# Patient Record
Sex: Female | Born: 1960 | Race: White | Hispanic: No | Smoking: Current every day smoker
Health system: Southern US, Community
[De-identification: ages and names within clinical notes are randomized; demographics above are authoritative.]

## PROBLEM LIST (undated history)

## (undated) DIAGNOSIS — F32A Depression, unspecified: Secondary | ICD-10-CM

## (undated) DIAGNOSIS — E785 Hyperlipidemia, unspecified: Secondary | ICD-10-CM

## (undated) DIAGNOSIS — M2550 Pain in unspecified joint: Secondary | ICD-10-CM

## (undated) DIAGNOSIS — F419 Anxiety disorder, unspecified: Secondary | ICD-10-CM

## (undated) DIAGNOSIS — R87810 Cervical high risk human papillomavirus (HPV) DNA test positive: Secondary | ICD-10-CM

## (undated) DIAGNOSIS — R87619 Unspecified abnormal cytological findings in specimens from cervix uteri: Secondary | ICD-10-CM

## (undated) DIAGNOSIS — F329 Major depressive disorder, single episode, unspecified: Secondary | ICD-10-CM

## (undated) HISTORY — DX: Depression, unspecified: F32.A

## (undated) HISTORY — DX: Anxiety disorder, unspecified: F41.9

## (undated) HISTORY — PX: PELVIC FRACTURE SURGERY: SHX119

## (undated) HISTORY — DX: Major depressive disorder, single episode, unspecified: F32.9

## (undated) HISTORY — DX: Cervical high risk human papillomavirus (HPV) DNA test positive: R87.810

## (undated) HISTORY — PX: BLADDER SURGERY: SHX569

## (undated) HISTORY — DX: Pain in unspecified joint: M25.50

## (undated) HISTORY — DX: Hyperlipidemia, unspecified: E78.5

## (undated) HISTORY — DX: Unspecified abnormal cytological findings in specimens from cervix uteri: R87.619

---

## 2000-05-17 HISTORY — PX: CLAVICLE SURGERY: SHX598

## 2005-09-27 ENCOUNTER — Ambulatory Visit: Payer: Self-pay | Admitting: Family Medicine

## 2006-11-02 ENCOUNTER — Ambulatory Visit: Payer: Self-pay | Admitting: Family Medicine

## 2008-04-08 ENCOUNTER — Ambulatory Visit: Payer: Self-pay | Admitting: Family Medicine

## 2008-11-18 IMAGING — MG MMM DGTL SCREENING MAMMO W/CAD
2 series · 8 of 8 positions shown · non-contrast
Comparison: none

REASON FOR EXAM: Screening Mammo
COMMENTS:

[Series 861: R CC · right · 4 of 4 slices shown (1 of 2)]
[im 1/4]
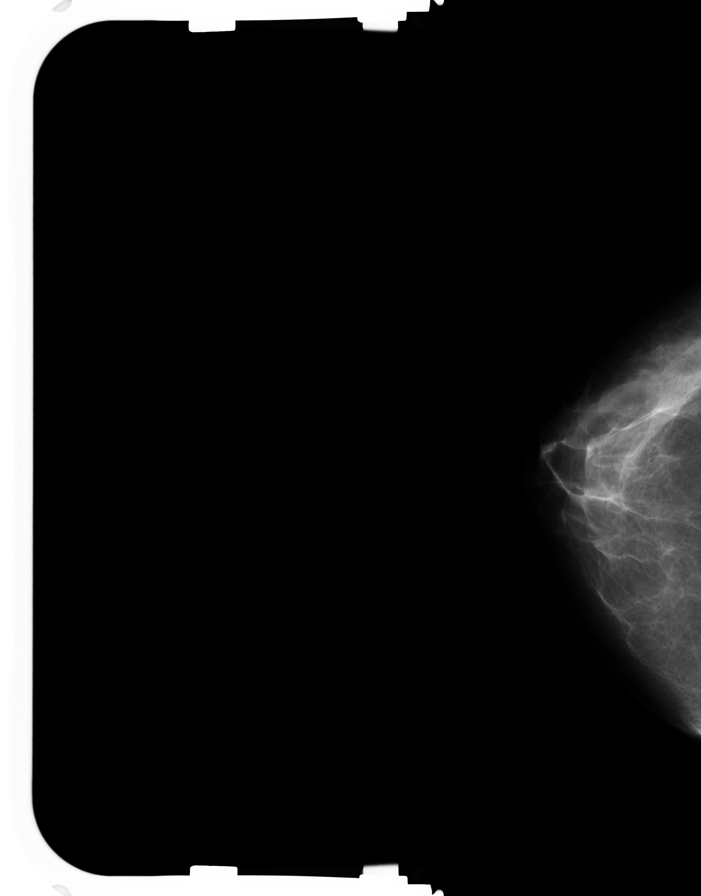
[im 2/4]
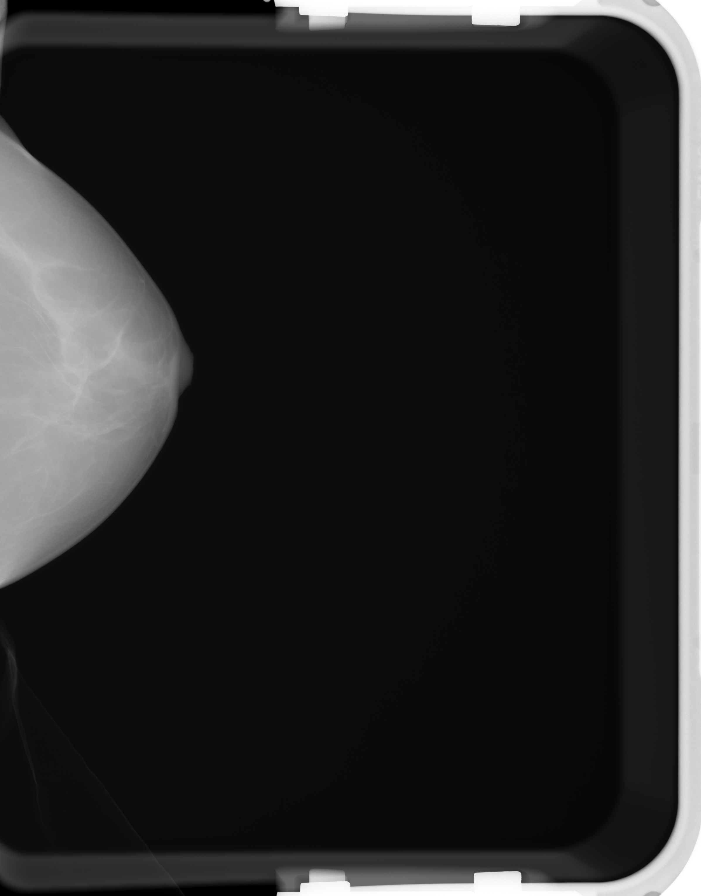
[im 3/4]
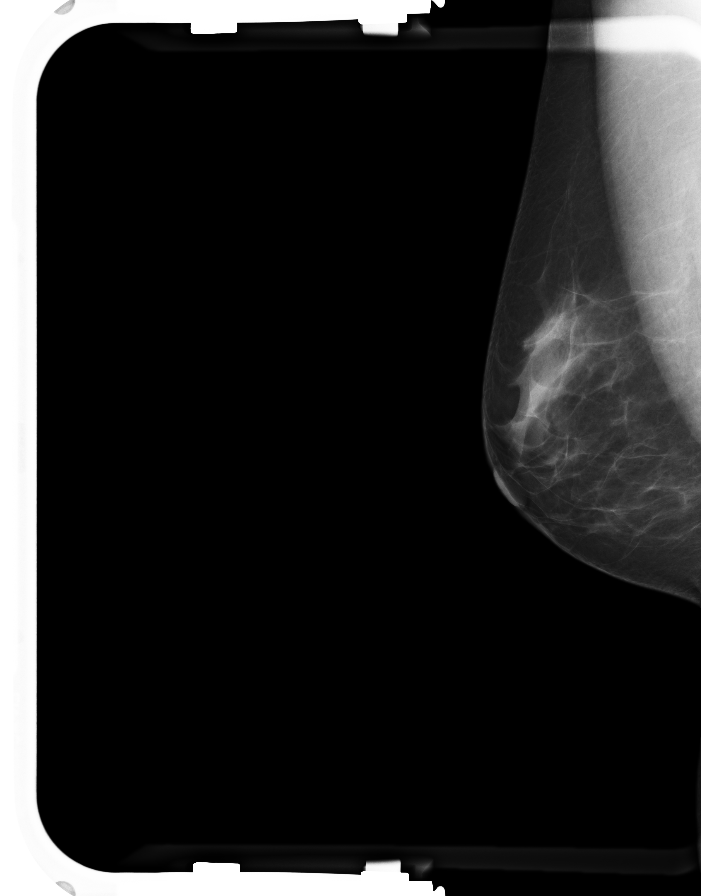
[im 4/4]
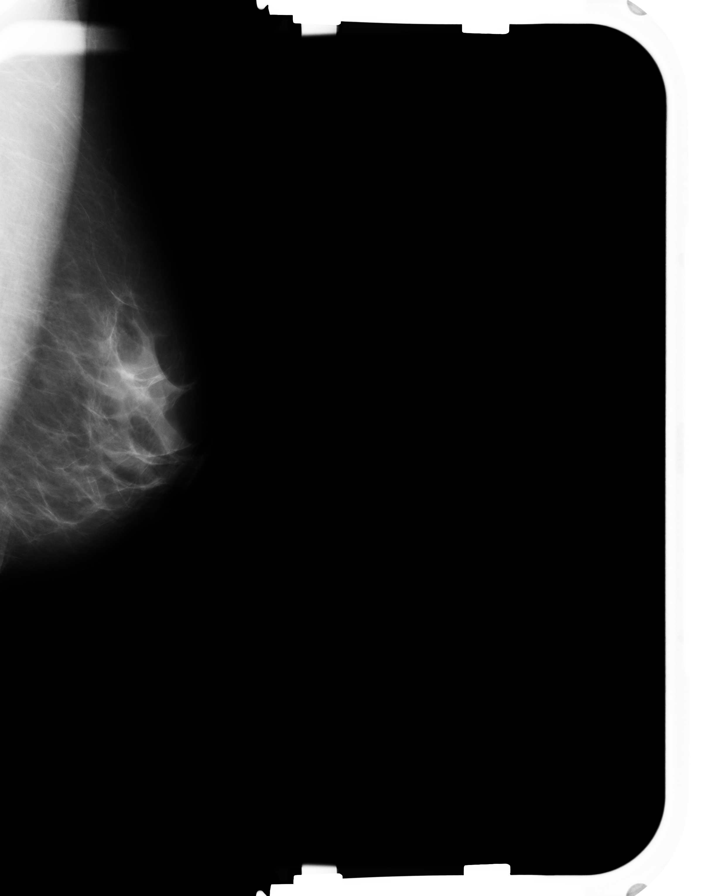

[Series 864: R CC · right · 4 of 4 slices shown (2 of 2)]
[im 1/4]
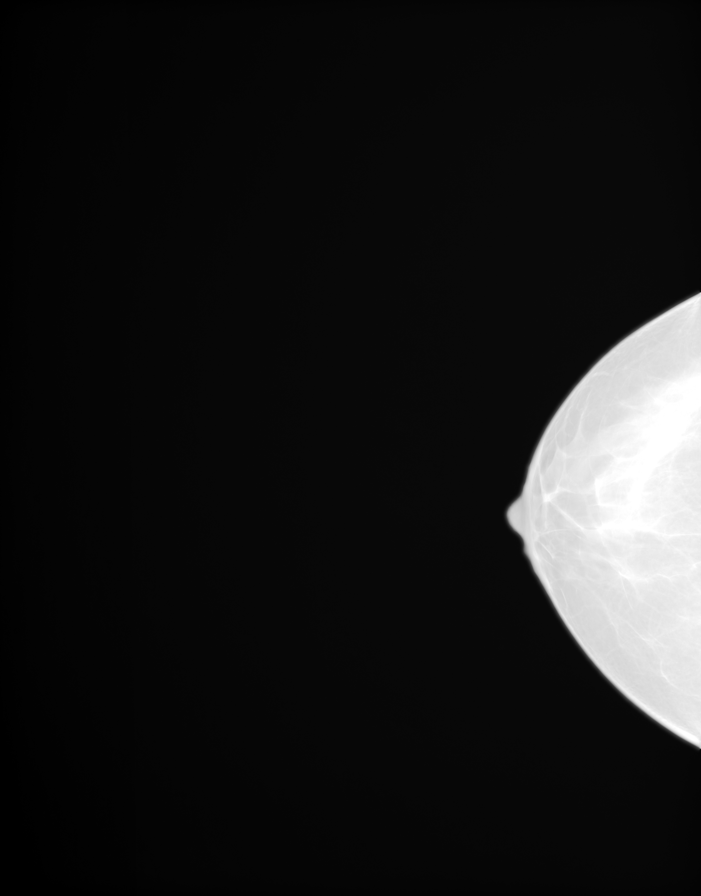
[im 2/4]
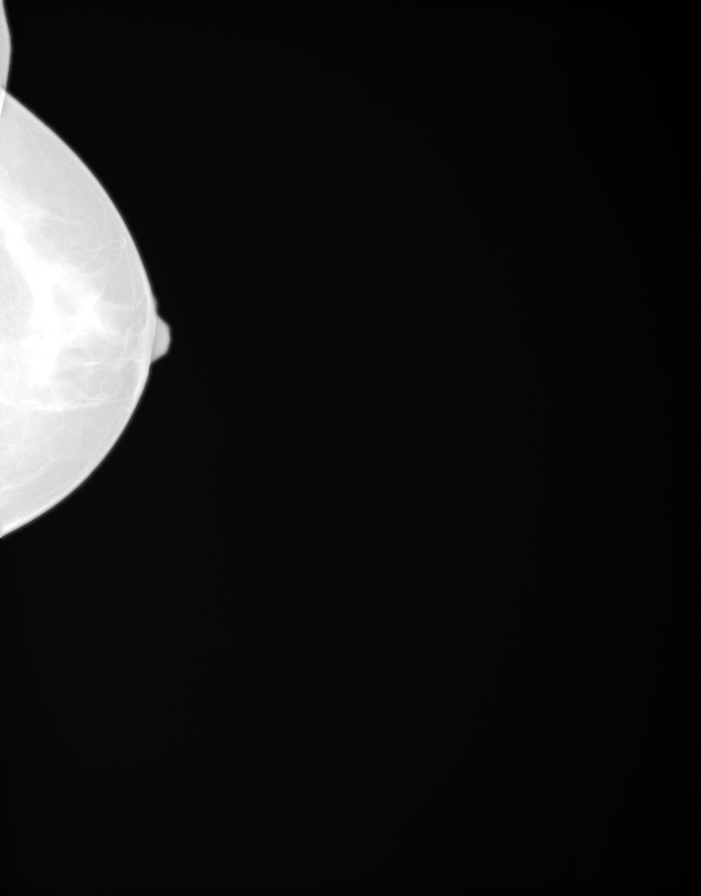
[im 3/4]
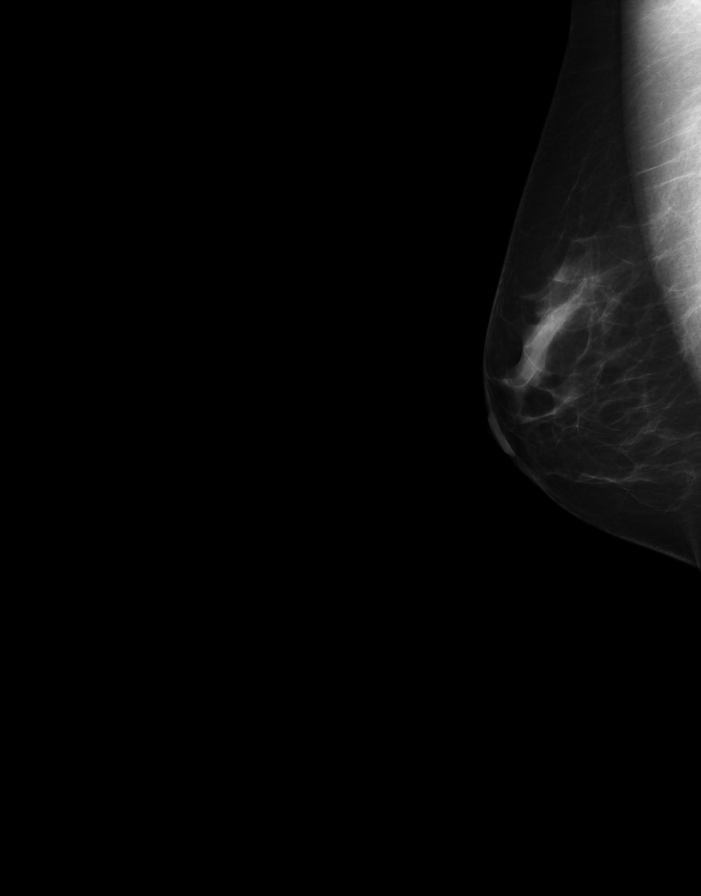
[im 4/4]
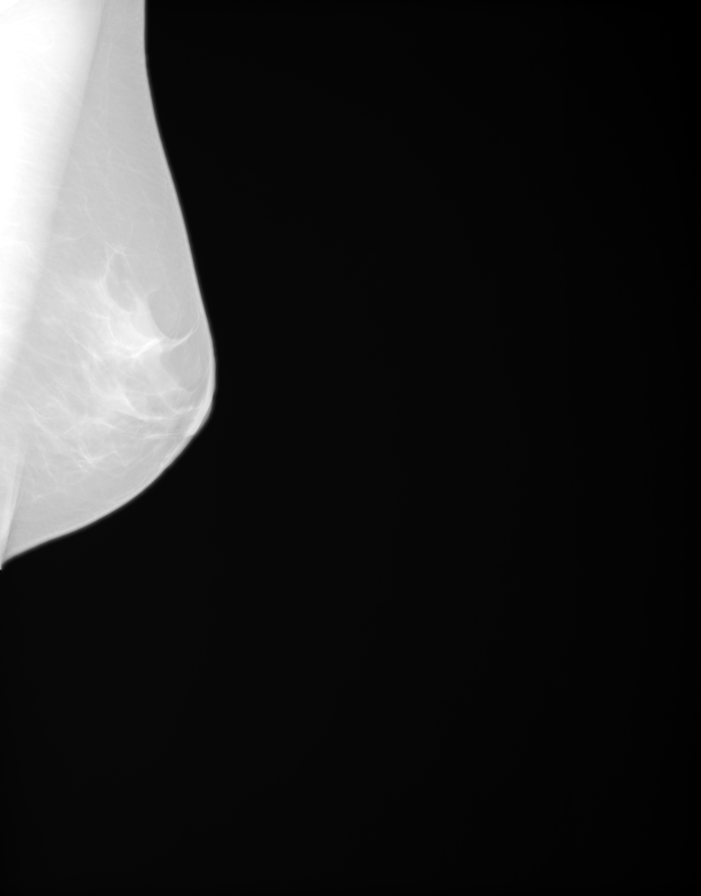

[8 of 8 positions shown; findings below may reference images not displayed]

PROCEDURE:     MMM - MMM DGTL SCREENING MAMMO W/CAD  - November 02, 2006  [DATE]

RESULT:     Comparison is made to film screen images performed on 05/04/96
as well as 09/27/05.  The breasts exhibit a mildly dense to moderately dense
parenchymal pattern.  There is no evidence of an area of developing density,
dominant mass, or malignant calcification.  The appearance is stable.
IMPRESSION: Stable, benign-appearing bilateral mammogram.

BI-RADS 2 - Benign Findings.

Please continue to encourage annual mammographic followup.

A NEGATIVE MAMMOGRAM REPORT DOES NOT PRECLUDE BIOPSY OR OTHER EVALUATION OF
A CLINICALLY PALPABLE OR OTHERWISE SUSPICIOUS MASS OR LESION.  BREAST CANCER
MAY NOT BE DETECTED BY MAMMOGRAPHY IN UP TO 10% OF CASES.

## 2009-04-28 ENCOUNTER — Ambulatory Visit: Payer: Self-pay | Admitting: Family Medicine

## 2010-04-25 IMAGING — MG MMM DGTL SCREENING MAMMO W/CAD
1 series · 4 of 4 positions shown · non-contrast
Comparison: none

REASON FOR EXAM: Screening mammogram
COMMENTS:

PROCEDURE:     MMM - MMM DGTL SCREENING MAMMO W/CAD  - April 08, 2008  [DATE]
RESULT:     No dominant masses or pathologic clustered calcifications are
demonstrated. CAD evaluation is nonfocal.

[Series 5187: R CC · right · 4 of 4 slices shown]
[im 1/4]
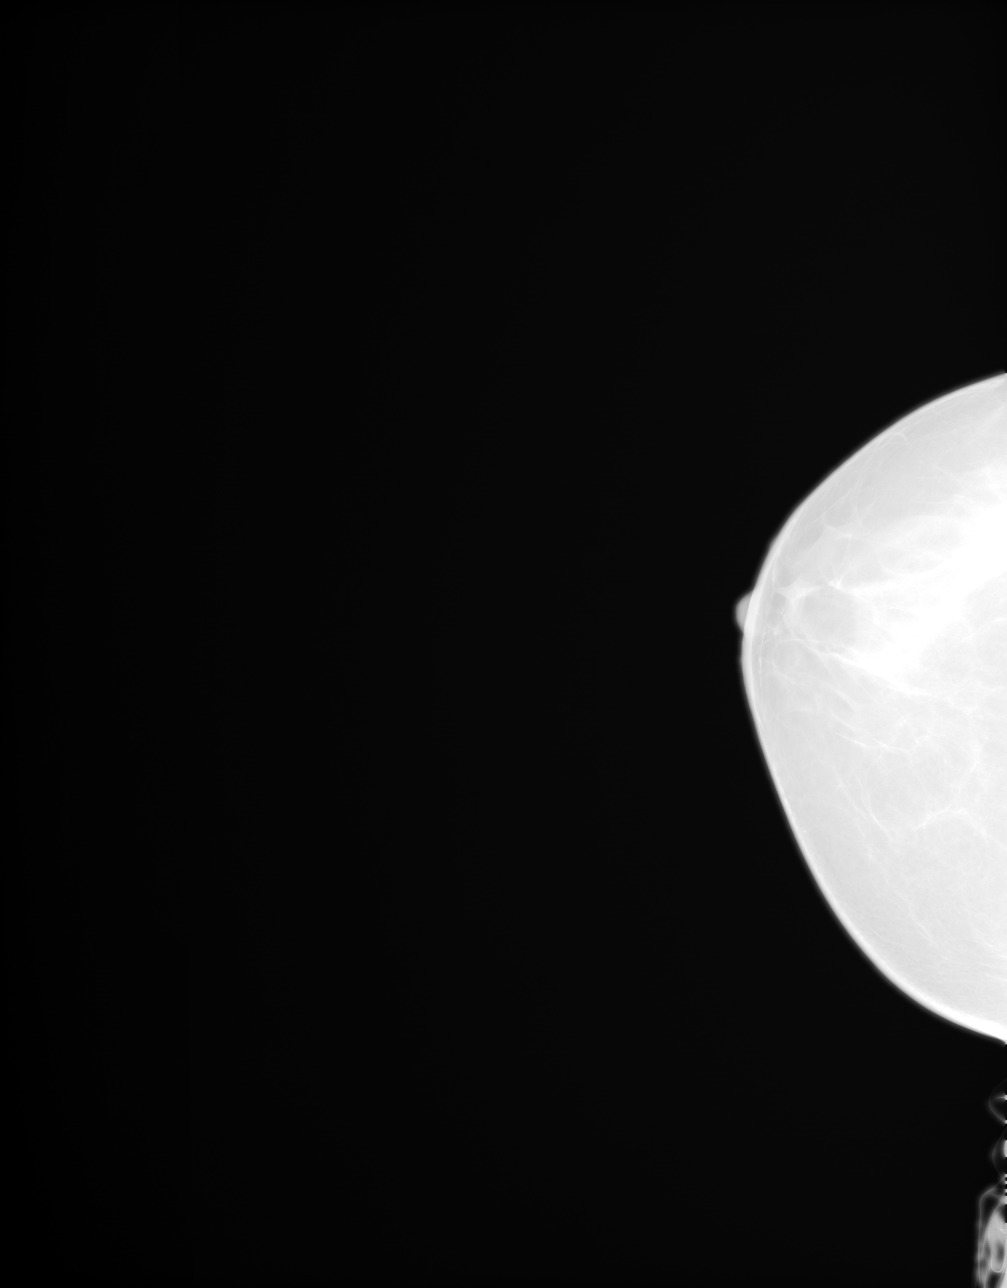
[im 2/4]
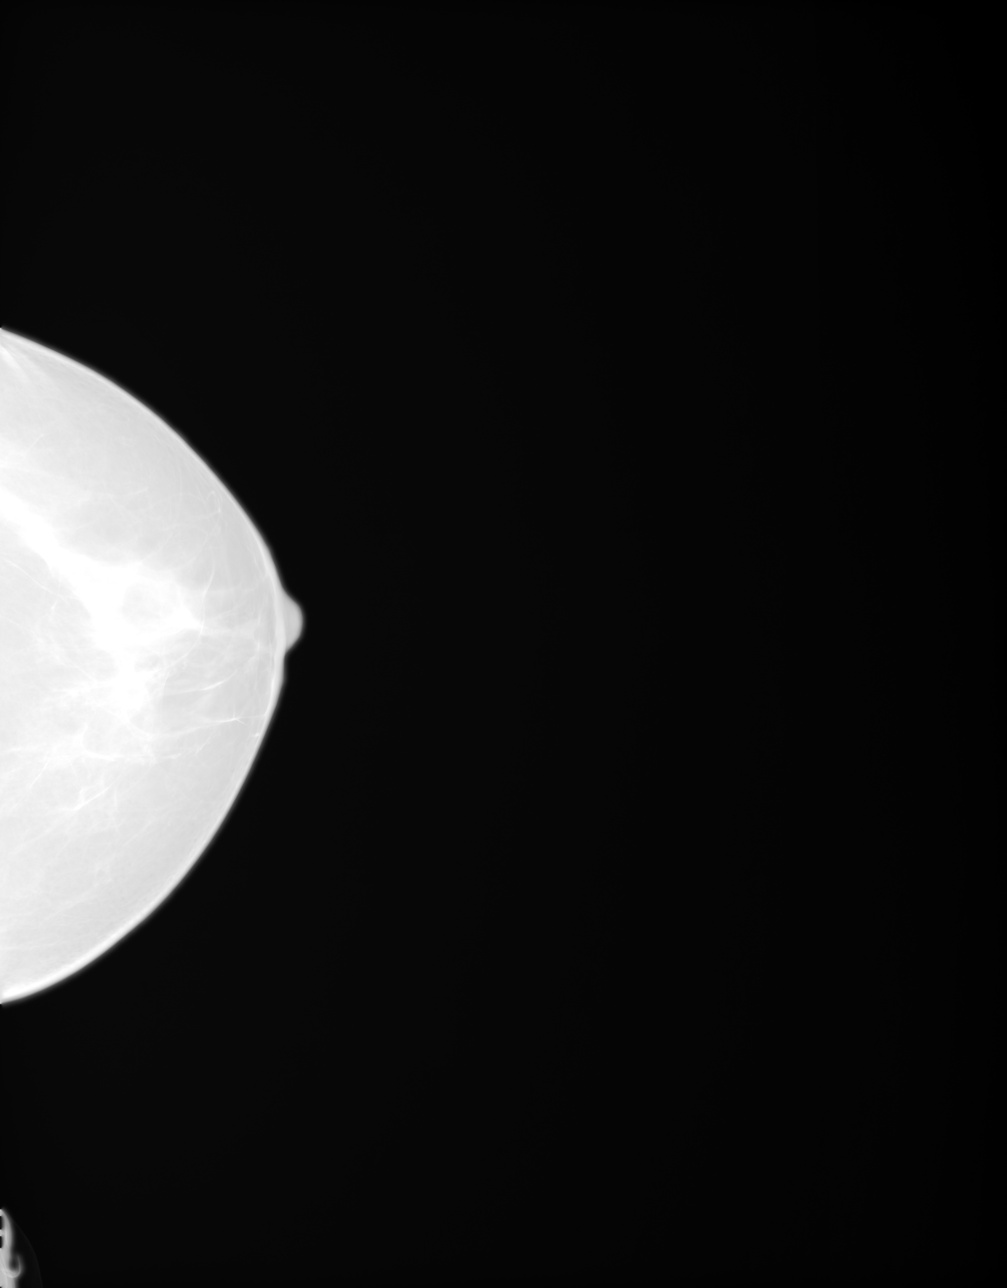
[im 3/4]
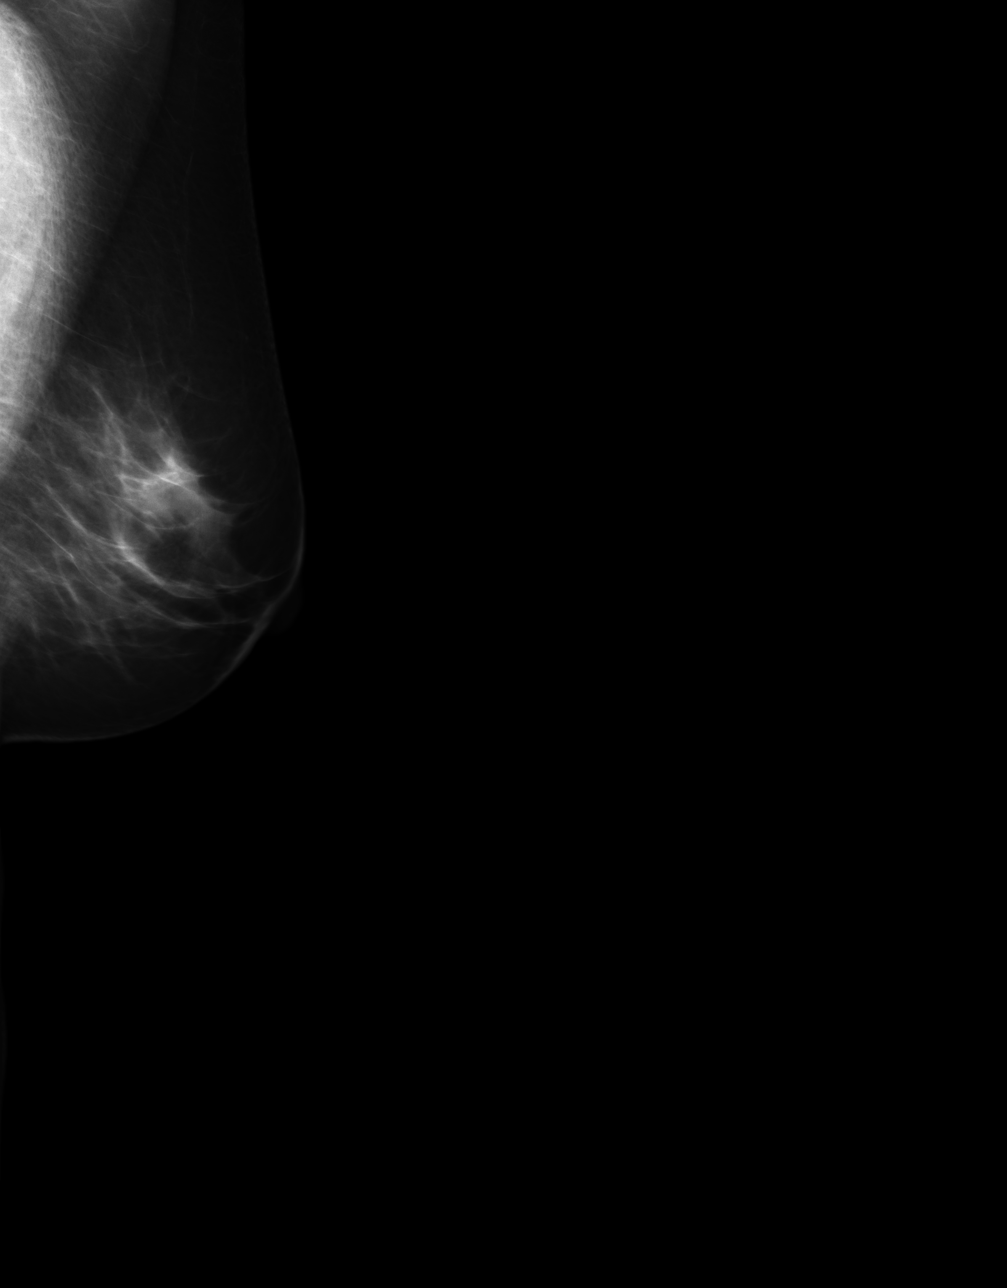
[im 4/4]
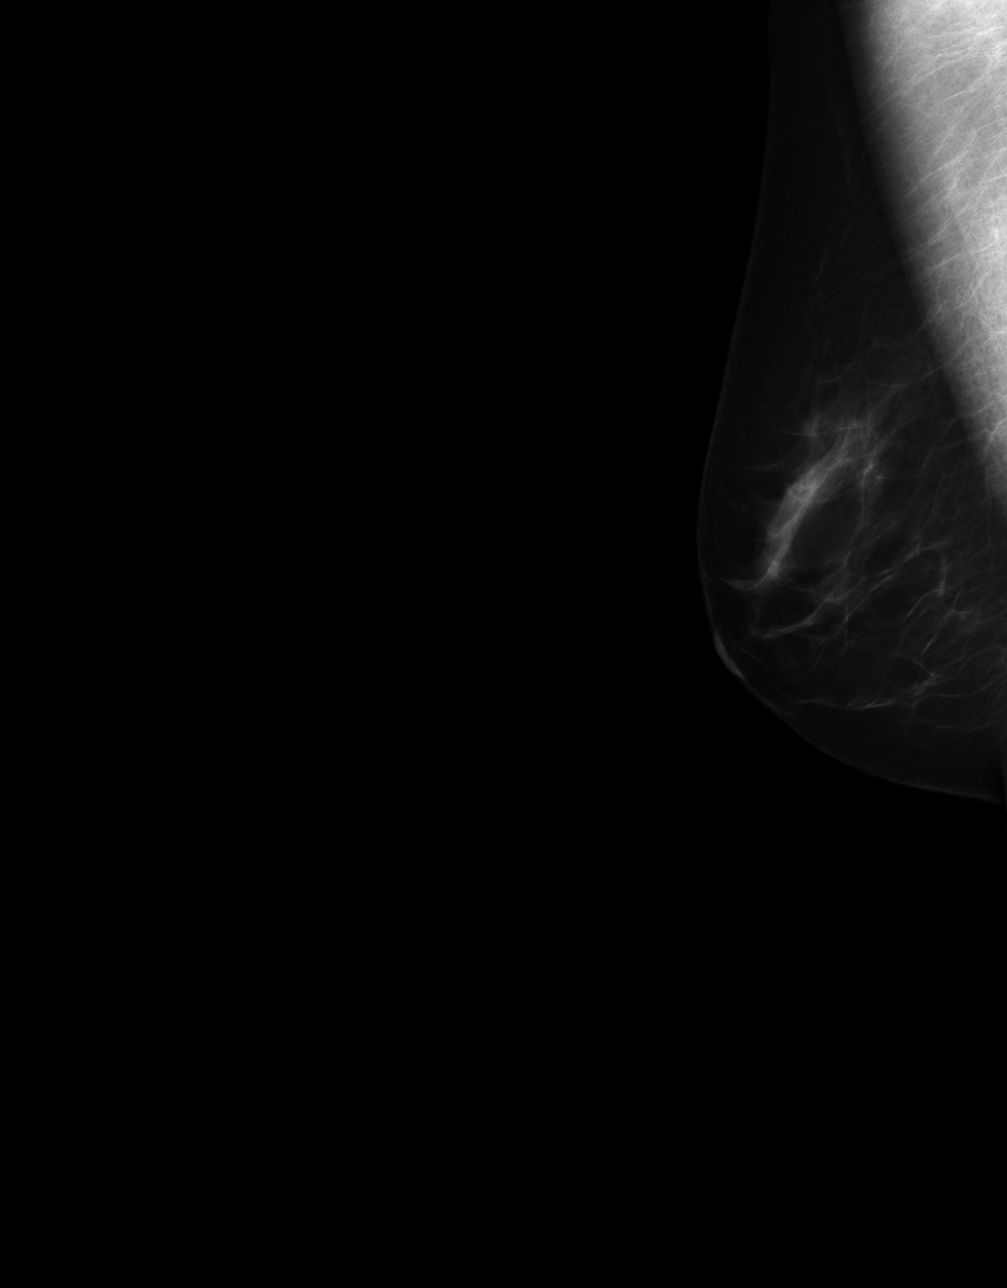

[4 of 4 positions shown; findings below may reference images not displayed]

IMPRESSION: Benign exam. Routine yearly follow-up mammograms are
suggested.

BI-RADS: Category 2 - Benign Finding

## 2010-05-20 HISTORY — PX: COLPOSCOPY: SHX161

## 2010-12-16 HISTORY — PX: FINGER FRACTURE SURGERY: SHX638

## 2011-05-15 IMAGING — MG MMM DGTL SCREENING MAMMO W/CAD
1 series · 4 of 4 positions shown · non-contrast
Comparison: none

REASON FOR EXAM: SCREENING
COMMENTS:

PROCEDURE:     MMM - MMM DGTL SCREENING MAMMO W/CAD  - April 28, 2009 [DATE]
RESULT:       Comparison is made to prior examinations of 04/08/08, 11/02/06
and 09/27/05.
No mass or  malignant-appearing microcalcifications are seen. There are
scattered fibroglandular densities bilaterally.

[Series 762: R CC · right · 4 of 4 slices shown]
[im 1/4]
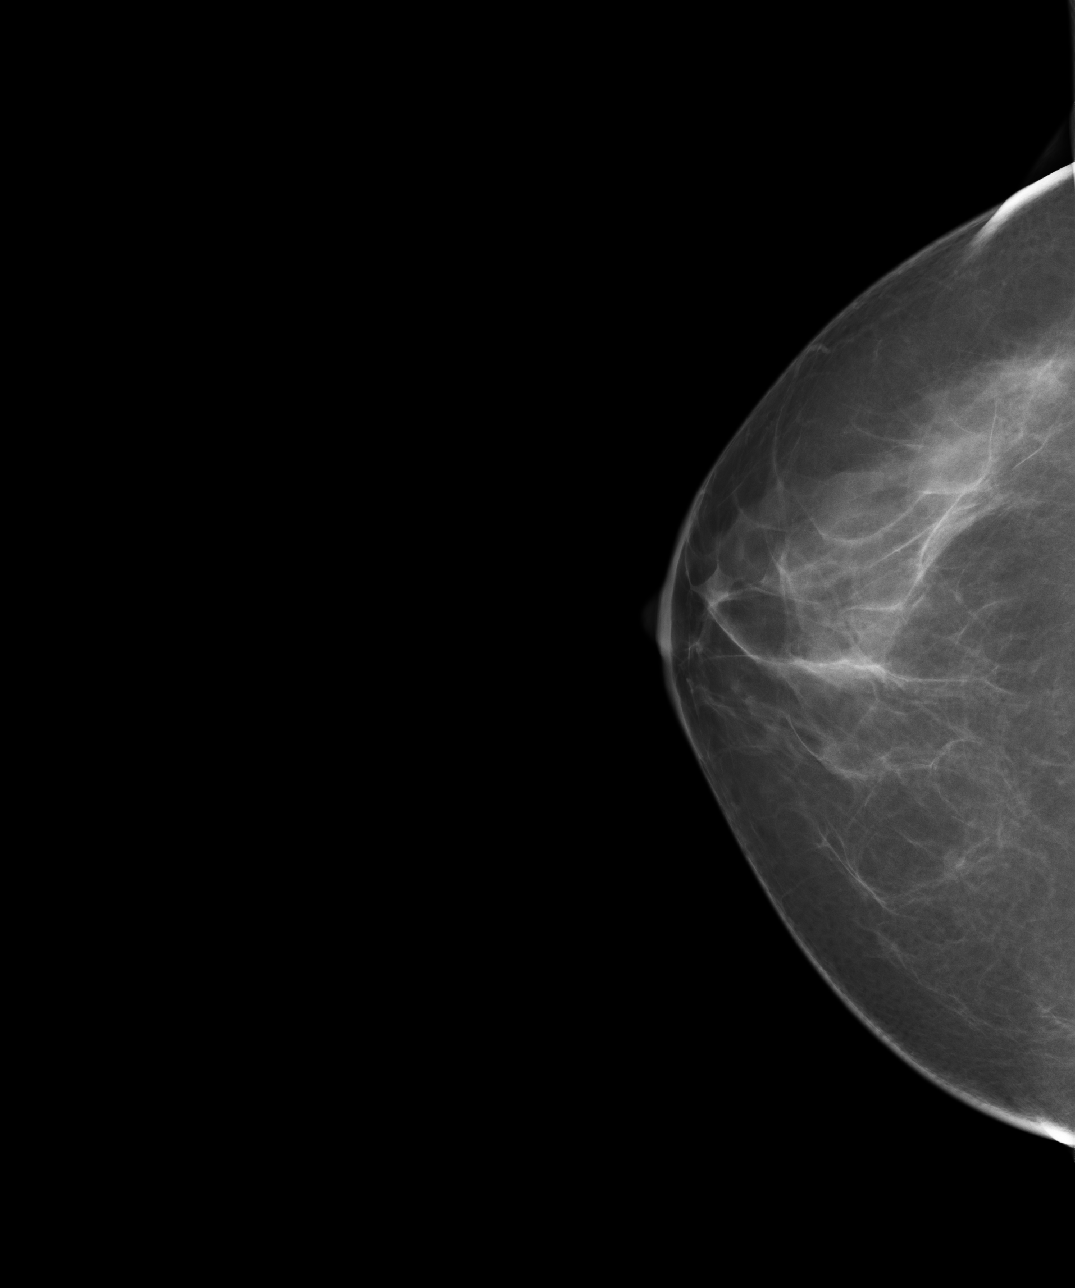
[im 2/4]
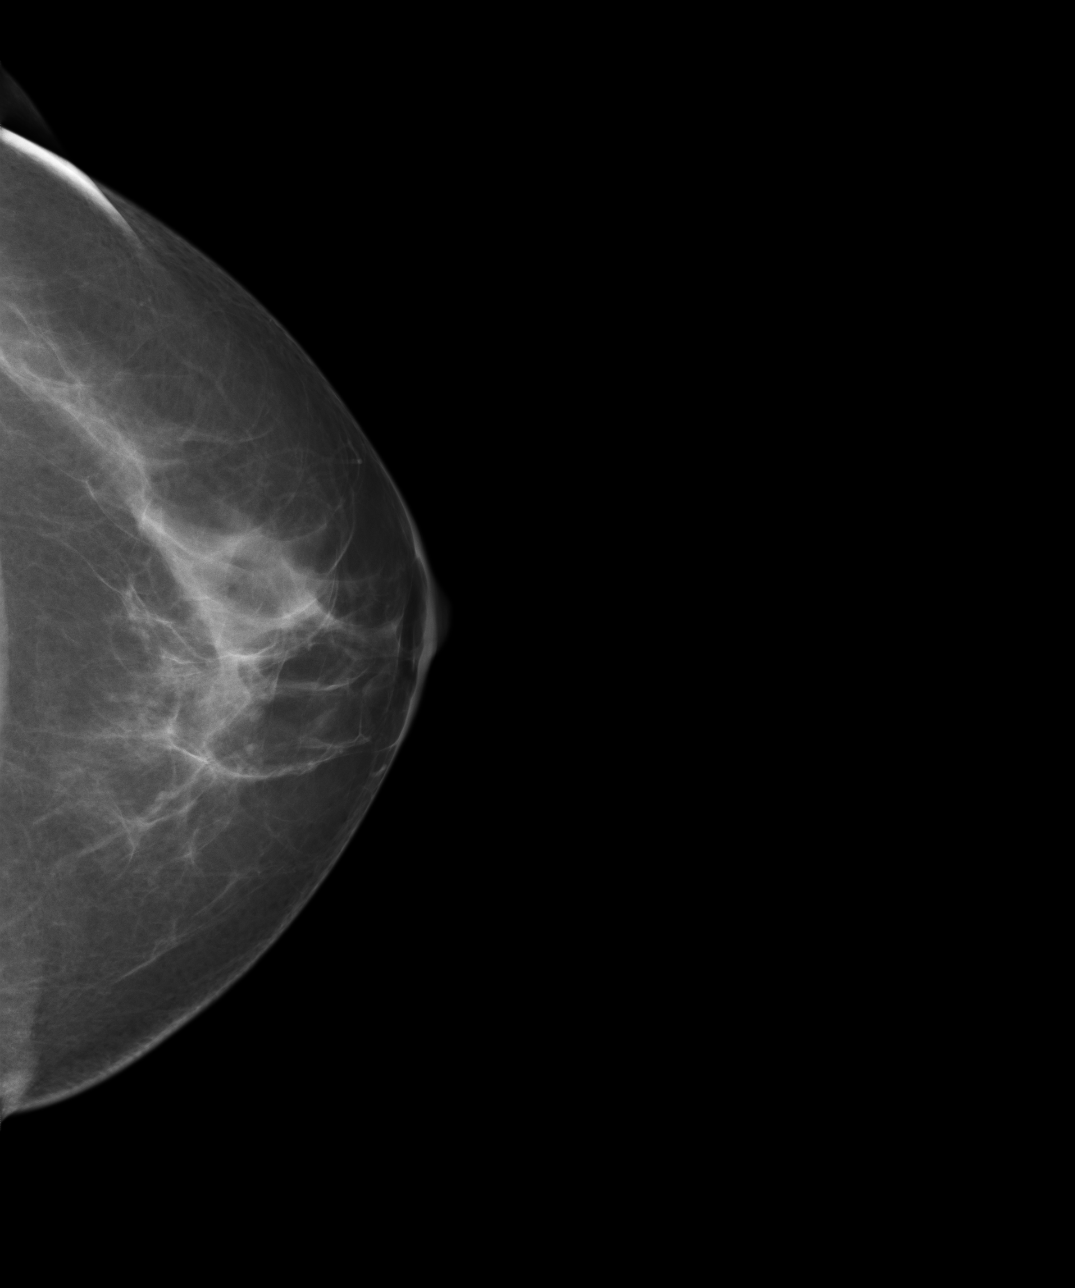
[im 3/4]
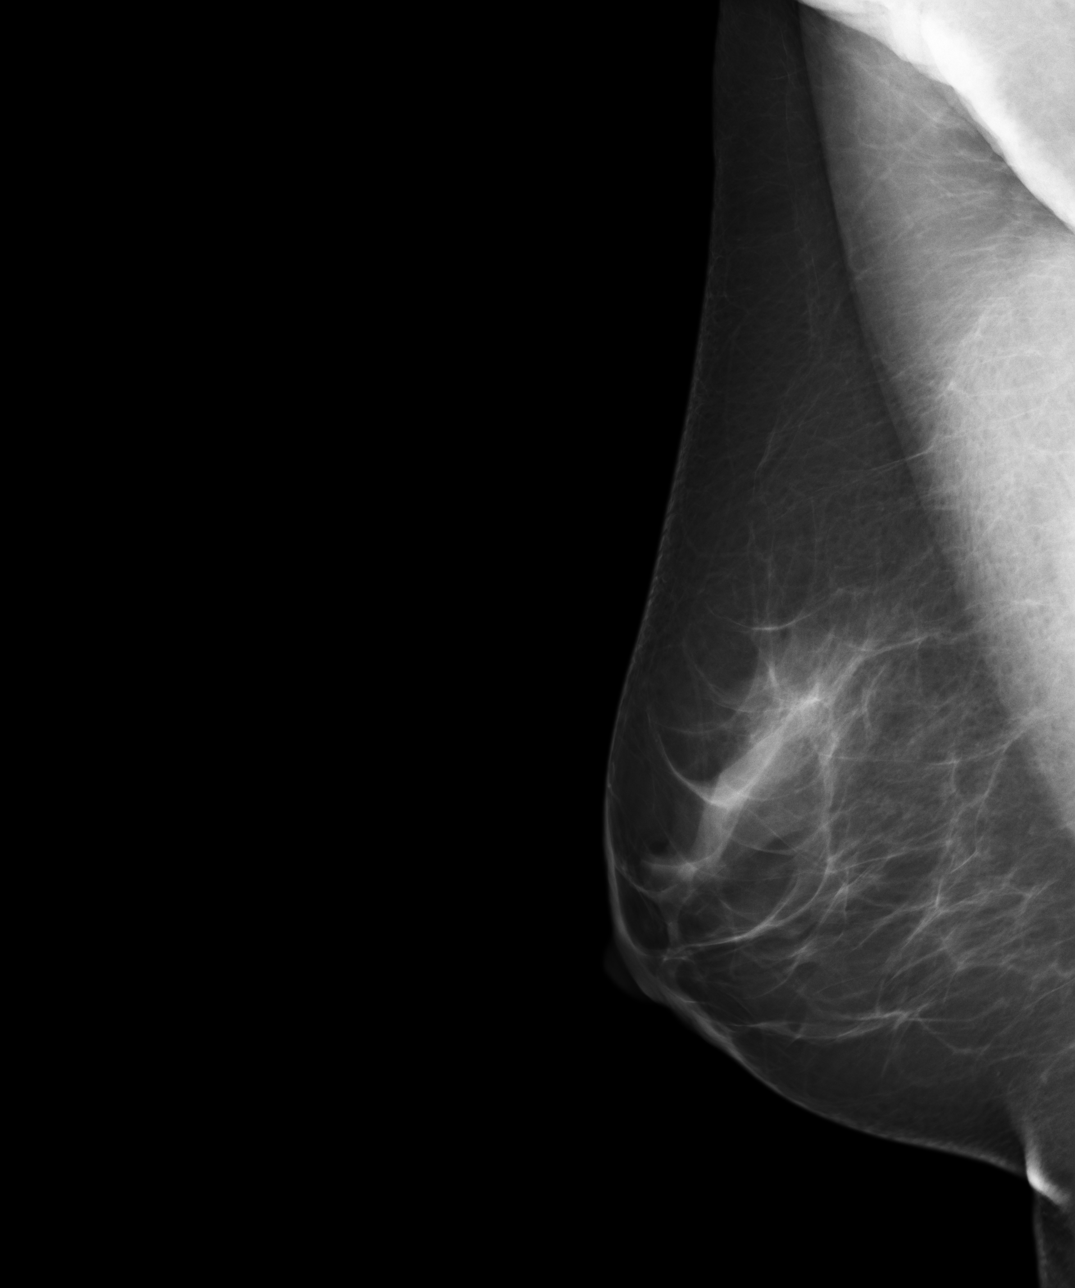
[im 4/4]
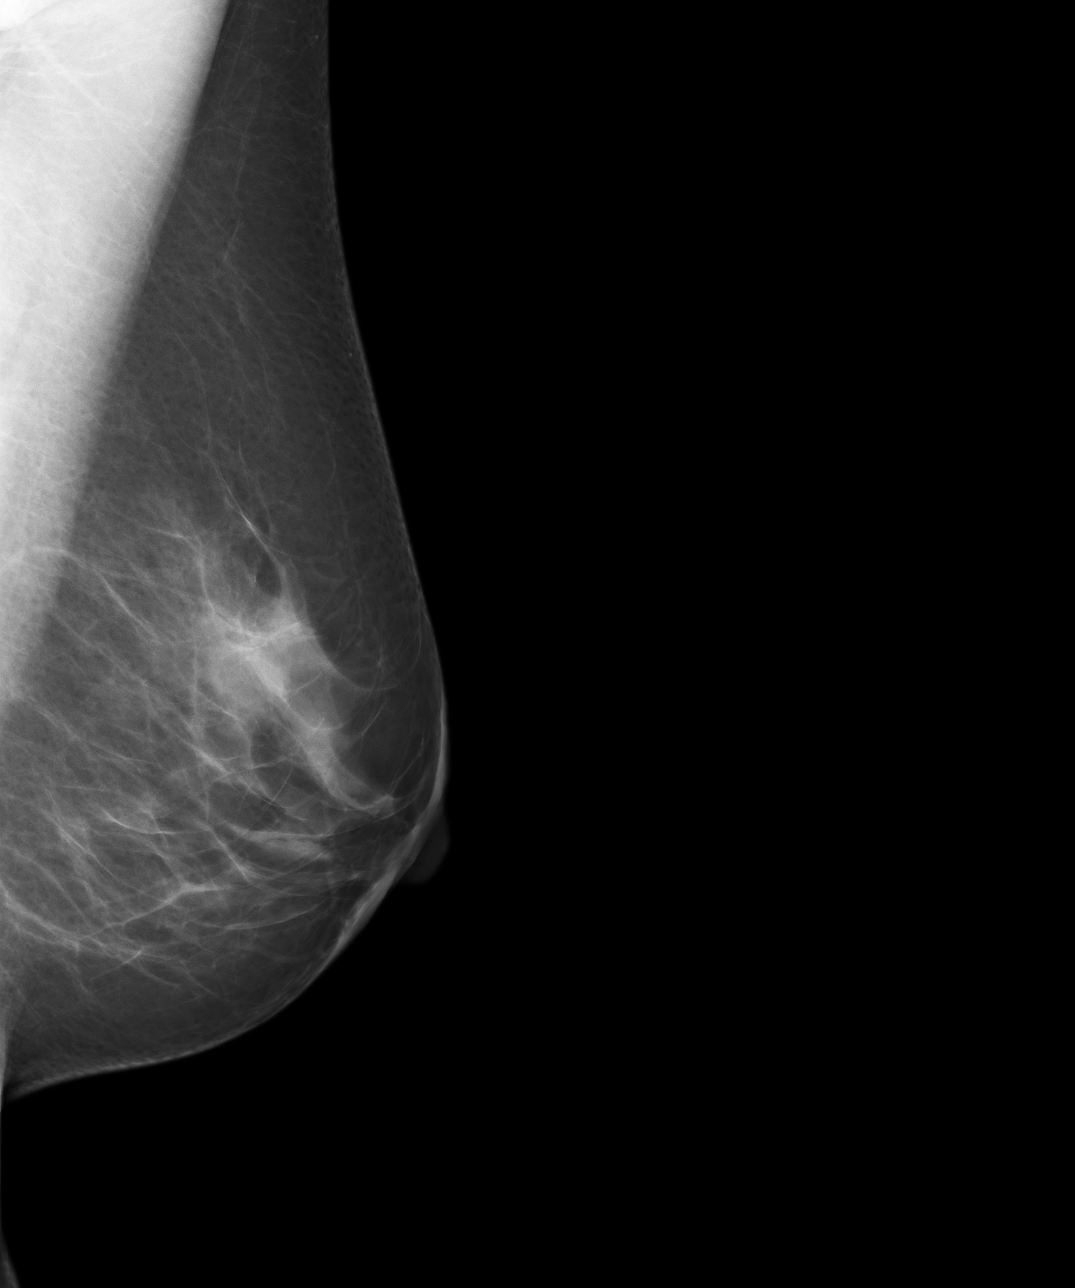

[4 of 4 positions shown; findings below may reference images not displayed]

IMPRESSION: 1.     Bilateral benign-appearing screening mammography.
2.     Continued annual screening mammography is recommended.
3.     BI-RADS:  Category 1- Negative.

A negative mammogram report does not preclude biopsy or other evaluation of
a clinically palpable or otherwise suspicious mass or lesion. Breast cancer
may not be detected by mammography in up to 10% of cases.

## 2011-12-20 DIAGNOSIS — S069X9A Unspecified intracranial injury with loss of consciousness of unspecified duration, initial encounter: Secondary | ICD-10-CM | POA: Insufficient documentation

## 2012-01-12 DIAGNOSIS — F0781 Postconcussional syndrome: Secondary | ICD-10-CM | POA: Insufficient documentation

## 2012-02-21 DIAGNOSIS — N3281 Overactive bladder: Secondary | ICD-10-CM | POA: Insufficient documentation

## 2012-03-11 DIAGNOSIS — S3729XA Other injury of bladder, initial encounter: Secondary | ICD-10-CM | POA: Insufficient documentation

## 2012-04-19 DIAGNOSIS — N393 Stress incontinence (female) (male): Secondary | ICD-10-CM | POA: Insufficient documentation

## 2013-01-15 HISTORY — PX: BLADDER SUSPENSION: SHX72

## 2014-04-18 DIAGNOSIS — F322 Major depressive disorder, single episode, severe without psychotic features: Secondary | ICD-10-CM | POA: Insufficient documentation

## 2014-04-18 DIAGNOSIS — R4189 Other symptoms and signs involving cognitive functions and awareness: Secondary | ICD-10-CM | POA: Insufficient documentation

## 2014-06-18 LAB — HM PAP SMEAR: HM Pap smear: NEGATIVE

## 2014-10-17 ENCOUNTER — Ambulatory Visit (INDEPENDENT_AMBULATORY_CARE_PROVIDER_SITE_OTHER): Payer: BC Managed Care – PPO | Admitting: Family Medicine

## 2014-10-17 ENCOUNTER — Encounter: Payer: Self-pay | Admitting: Family Medicine

## 2014-10-17 VITALS — BP 138/80 | HR 80 | Ht 66.0 in | Wt 138.0 lb

## 2014-10-17 DIAGNOSIS — L739 Follicular disorder, unspecified: Secondary | ICD-10-CM | POA: Diagnosis not present

## 2014-10-17 DIAGNOSIS — J40 Bronchitis, not specified as acute or chronic: Secondary | ICD-10-CM

## 2014-10-17 LAB — POCT URINALYSIS DIPSTICK
BILIRUBIN UA: NEGATIVE
GLUCOSE UA: NEGATIVE
KETONES UA: NEGATIVE
Leukocytes, UA: NEGATIVE
Nitrite, UA: NEGATIVE
Protein, UA: NEGATIVE
RBC UA: NEGATIVE
Spec Grav, UA: 1.01
UROBILINOGEN UA: NEGATIVE
pH, UA: 6.5

## 2014-10-17 MED ORDER — AMOXICILLIN-POT CLAVULANATE 875-125 MG PO TABS: 1.0000 | ORAL_TABLET | Freq: Two times a day (BID) | ORAL | Status: DC

## 2014-10-17 NOTE — Progress Notes (Deleted)
Name: Suzanne Chapman   MRN: [id number]    DOB: [birth year]   Date:10/17/2014       Progress Note  Subjective  Chief Complaint  Chief Complaint  Patient presents with  . Cough    green mucus/ hurting behind eyes  . Urinary Tract Infection    burning and pressure    Cough This is a new problem. The current episode started in the past 7 days. The problem has been unchanged. The problem occurs constantly. The cough is productive of purulent sputum. Associated symptoms include nasal congestion. Pertinent negatives include no chest pain, chills, fever, heartburn, myalgias, postnasal drip, rash, sore throat, shortness of breath or wheezing. Risk factors for lung disease include smoking/tobacco exposure. She has tried nothing for the symptoms. The treatment provided mild relief.  Urinary Tract Infection  This is a new problem. The current episode started in the past 7 days. The problem has been unchanged. The quality of the pain is described as aching. The pain is mild. There has been no fever. Pertinent negatives include no chills, frequency, nausea or urgency. She has tried nothing for the symptoms.    No problem-specific assessment & plan notes found for this encounter.   Past Medical History  Diagnosis Date  . Depression   . Anxiety   . Joint pain   . Hyperlipidemia     Past Surgical History  Procedure Laterality Date  . Pelvic fracture surgery    . Clavicle surgery  05/17/2000    Family History  Problem Relation Age of Onset  . Heart disease Father     History   Social History  . Marital Status: Legally Separated    Spouse Name: N/A  . Number of Children: N/A  . Years of Education: N/A   Occupational History  . Not on file.   Social History Main Topics  . Smoking status: Current Every Day Smoker  . Smokeless tobacco: Not on file  . Alcohol Use: 0.0 oz/week    0 Standard drinks or equivalent per week  . Drug Use: No  . Sexual Activity: Not on file   Other Topics  Concern  . Not on file   Social History Narrative  . No narrative on file    No Known Allergies   Review of Systems  Constitutional: Negative for fever and chills.  HENT: Positive for congestion. Negative for ear discharge, postnasal drip and sore throat.   Eyes: Negative for blurred vision.  Respiratory: Positive for cough and sputum production. Negative for shortness of breath and wheezing.   Cardiovascular: Negative for chest pain.  Gastrointestinal: Negative for heartburn, nausea, diarrhea and constipation.  Genitourinary: Negative for urgency and frequency.  Musculoskeletal: Negative for myalgias and back pain.  Skin: Negative for rash.  Neurological: Negative for dizziness.  Psychiatric/Behavioral: Negative for depression.     Objective  Filed Vitals:   10/17/14 1418  BP: 138/80  Pulse: 80  Height: 5\' 6"  (1.676 m)  Weight: 138 lb (62.596 kg)    Physical Exam    No results found for this or any previous visit (from the past 2160 hour(s)).   Assessment & Plan  Problem List Items Addressed This Visit    None        [facility] Sauereanna Jones [facility]  10/17/2014

## 2014-10-17 NOTE — Progress Notes (Signed)
Name: Jerilee Hohamela O Hogue   MRN: [id number]    DOB: 29-Jul-1960   Date:10/17/2014       Progress Note  Subjective  Chief Complaint  Chief Complaint  Patient presents with  . Cough    green mucus/ hurting behind eyes  . Urinary Tract Infection    burning and pressure    HPI  No problem-specific assessment & plan notes found for this encounter.   Past Medical History  Diagnosis Date  . Depression   . Anxiety   . Joint pain   . Hyperlipidemia     Past Surgical History  Procedure Laterality Date  . Pelvic fracture surgery    . Clavicle surgery  05/17/2000    Family History  Problem Relation Age of Onset  . Heart disease Father     History   Social History  . Marital Status: Legally Separated    Spouse Name: N/A  . Number of Children: N/A  . Years of Education: N/A   Occupational History  . Not on file.   Social History Main Topics  . Smoking status: Current Every Day Smoker  . Smokeless tobacco: Not on file  . Alcohol Use: 0.0 oz/week    0 Standard drinks or equivalent per week  . Drug Use: No  . Sexual Activity: Not on file   Other Topics Concern  . Not on file   Social History Narrative  . No narrative on file    No Known Allergies   ROS   Objective  Filed Vitals:   10/17/14 1418  BP: 138/80  Pulse: 80  Height: 5\' 6"  (1.676 m)  Weight: 138 lb (62.596 kg)    Physical Exam    No results found for this or any previous visit (from the past 2160 hour(s)).   Assessment & Plan  Problem List Items Addressed This Visit    None    Visit Diagnoses    Bronchitis    -  Primary    Relevant Medications    amoxicillin-clavulanate (AUGMENTIN) 875-125 MG per tablet    Acute folliculitis        Relevant Medications    amoxicillin-clavulanate (AUGMENTIN) 875-125 MG per tablet         Dr. Hayden Rasmusseneanna Dontavious [facility]  10/17/2014

## 2014-11-08 ENCOUNTER — Ambulatory Visit
Admission: EM | Admit: 2014-11-08 | Discharge: 2014-11-08 | Disposition: A | Discharge status: Home / Self Care | Payer: BC Managed Care – PPO | Attending: Internal Medicine | Admitting: Internal Medicine

## 2014-11-08 ENCOUNTER — Encounter: Payer: Self-pay | Admitting: Emergency Medicine

## 2014-11-08 DIAGNOSIS — Z7689 Persons encountering health services in other specified circumstances: Secondary | ICD-10-CM

## 2014-11-08 DIAGNOSIS — Z0189 Encounter for other specified special examinations: Secondary | ICD-10-CM

## 2014-11-08 DIAGNOSIS — L732 Hidradenitis suppurativa: Secondary | ICD-10-CM

## 2014-11-08 MED ORDER — MUPIROCIN 2 % EX OINT: 1.0000 "application " | TOPICAL_OINTMENT | Freq: Two times a day (BID) | CUTANEOUS | Status: DC

## 2014-11-08 MED ORDER — SULFAMETHOXAZOLE-TRIMETHOPRIM 800-160 MG PO TABS: 1.0000 | ORAL_TABLET | Freq: Two times a day (BID) | ORAL | Status: DC

## 2014-11-08 NOTE — ED Notes (Signed)
Patient c/o tender bumps on her labia for the past 3-4 days.  Patient denies fever.

## 2014-11-08 NOTE — Discharge Instructions (Signed)
Hidradenitis Suppurativa, Sweat Gland Abscess Hidradenitis suppurativa is a long lasting (chronic), uncommon disease of the sweat glands. With this, boil-like lumps and scarring develop in the groin, some times under the arms (axillae), and under the breasts. It may also uncommonly occur behind the ears, in the crease of the buttocks, and around the genitals.  CAUSES  The cause is from a blocking of the sweat glands. They then become infected. It may cause drainage and odor. It is not contagious. So it cannot be given to someone else. It most often shows up in puberty (about 72 to 56 years of age). But it may happen much later. It is similar to acne which is a disease of the sweat glands. This condition is slightly more common in African-Americans and women. SYMPTOMS   Hidradenitis usually starts as one or more red, tender, swellings in the groin or under the arms (axilla).  Over a period of hours to days the lesions get larger. They often open to the skin surface, draining clear to yellow-colored fluid.  The infected area heals with scarring. DIAGNOSIS  Your caregiver makes this diagnosis by looking at you. Sometimes cultures (growing germs on plates in the lab) may be taken. This is to see what germ (bacterium) is causing the infection.  TREATMENT   Topical germ killing medicine applied to the skin (antibiotics) are the treatment of choice. Antibiotics taken by mouth (systemic) are sometimes needed when the condition is getting worse or is severe.  Avoid tight-fitting clothing which traps moisture in.  Dirt does not cause hidradenitis and it is not caused by poor hygiene.  Involved areas should be cleaned daily using an antibacterial soap. Some patients find that the liquid form of Lever 2000, applied to the involved areas as a lotion after bathing, can help reduce the odor related to this condition.  Sometimes surgery is needed to drain infected areas or remove scarred tissue. Removal of  large amounts of tissue is used only in severe cases.  Birth control pills may be helpful.  Oral retinoids (vitamin A derivatives) for 6 to 12 months which are effective for acne may also help this condition.  Weight loss will improve but not cure hidradenitis. It is made worse by being overweight. But the condition is not caused by being overweight.  This condition is more common in people who have had acne.  It may become worse under stress. There is no medical cure for hidradenitis. It can be controlled, but not cured. The condition usually continues for years with periods of getting worse and getting better (remission). Document Released: 12/16/2003 Document Revised: 07/26/2011 Document Reviewed: 08/03/2013 [facility] Patient Information 2015 [place], [place]. This information is not intended to replace advice given to you by your health care provider. Make sure you discuss any questions you have with your health care provider. Incision and Drainage Care After Refer to this sheet in the next few weeks. These instructions provide you with information on caring for yourself after your procedure. Your caregiver may also give you more specific instructions. Your treatment has been planned according to current medical practices, but problems sometimes occur. Call your caregiver if you have any problems or questions after your procedure. HOME CARE INSTRUCTIONS   If antibiotic medicine is given, take it as directed. Finish it even if you start to feel better.  Only take over-the-counter or prescription medicines for pain, discomfort, or fever as directed by your caregiver.  Keep all follow-up appointments as directed by your  caregiver.  Change any bandages (dressings) as directed by your caregiver. Replace old dressings with clean dressings.  Wash your hands before and after caring for your wound. You will receive specific instructions for cleansing and caring for your wound.  SEEK MEDICAL  CARE IF:   You have increased pain, swelling, or redness around the wound.  You have increased drainage, smell, or bleeding from the wound.  You have muscle aches, chills, or you feel generally sick.  You have a fever. MAKE SURE YOU:   Understand these instructions.  Will watch your condition.  Will get help right away if you are not doing well or get worse. Document Released: 07/26/2011 Document Reviewed: 07/26/2011 [facility] Patient Information 2015 [place], [place]. This information is not intended to replace advice given to you by your health care provider. Make sure you discuss any questions you have with your health care provider.

## 2014-11-08 NOTE — ED Provider Notes (Signed)
CSN: [id number]     Arrival date & time 11/08/14  0802 History   First MD Initiated Contact with Patient 11/08/14 [phone]     Chief Complaint  Patient presents with  . Abscess   (Consider location/radiation/quality/duration/timing/severity/associated sxs/prior Treatment) HPI Comments: Caucasian female with recurrent boils/carbuncles.  Treated with antibiotics for sinusitis by Dr Hyacinth Meeker .  Patient then developed lesion left buttock (carbuncle); worsening symptoms productive cough, lip border lesions clear fluid x 2 drained by patient last week resolved history of cold sores and patient treated for bronchitis and carbuncle with augmentin 2 Jun 16 symptoms resolved but new right labia lesions spreading/purulent discharge here for evaluation, drainage and treatment of infection. Patient has tried squeezing one lesion got some pus out and applying OTC neosporin to affected area without any improvement. Patient has had lesions in past drained by Gyn and urologist also.  Recurrent nose lesions/purulent discharge drained by patient none currently.  Chills, nausea x 1 day, headache x 2 days forehead.  Denied any sexual partners in past year. Hx of stress urinary incontinence wears pads in underwear.   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  The history is provided by the patient.    Past Medical History  Diagnosis Date  . Depression   . Anxiety   . Joint pain   . Hyperlipidemia    Past Surgical History  Procedure Laterality Date  . Pelvic fracture surgery    . Clavicle surgery  05/17/2000  . Bladder surgery     Family History  Problem Relation Age of Onset  . Heart disease Father    History  Substance Use Topics  . Smoking status: Current Every Day Smoker    Types: Cigarettes  . Smokeless tobacco: Not on file  . Alcohol Use: 0.0 oz/week    0 Standard drinks or equivalent per week   OB History    No data available     Review of Systems  Constitutional: Positive for chills. Negative for  fever, diaphoresis, activity change, appetite change and fatigue.  HENT: Negative for congestion, dental problem, drooling, ear discharge, ear pain, facial swelling, hearing loss, mouth sores, nosebleeds, postnasal drip, rhinorrhea, sinus pressure, sneezing, sore throat, tinnitus, trouble swallowing and voice change.   Eyes: Negative for photophobia, pain, discharge, redness, itching and visual disturbance.  Respiratory: Negative for cough, choking, chest tightness, shortness of breath, wheezing and stridor.   Cardiovascular: Negative for chest pain, palpitations and leg swelling.  Gastrointestinal: Positive for nausea. Negative for vomiting, abdominal pain, diarrhea, constipation, blood in stool, abdominal distention, anal bleeding and rectal pain.  Endocrine: Negative for cold intolerance and heat intolerance.  Genitourinary: Positive for genital sores. Negative for dysuria, flank pain, decreased urine volume, vaginal bleeding, vaginal discharge, enuresis, difficulty urinating, vaginal pain and pelvic pain.  Musculoskeletal: Negative for myalgias, back pain, joint swelling, arthralgias, gait problem, neck pain and neck stiffness.  Skin: Positive for color change and rash. Negative for pallor and wound.  Allergic/Immunologic: Negative for environmental allergies and food allergies.  Neurological: Positive for headaches. Negative for dizziness, tremors, seizures, syncope, facial asymmetry, speech difficulty, weakness and light-headedness.  Hematological: Negative for adenopathy. Does not bruise/bleed easily.  Psychiatric/Behavioral: Negative for behavioral problems, confusion, sleep disturbance and agitation.    Allergies  Review of patient's allergies indicates no known allergies.  Home Medications   Prior to Admission medications   Medication Sig Start Date End Date Taking? Authorizing Provider  atomoxetine (STRATTERA) 60 MG capsule Take 60 mg by mouth daily.  Yes Historical Provider, MD    ALPRAZolam Prudy Feeler) 0.5 MG tablet Take 1 tablet by mouth 2 (two) times daily. Dr Vladimir Crofts 11/15/11   Historical Provider, MD  amoxicillin-clavulanate (AUGMENTIN) 875-125 MG per tablet Take 1 tablet by mouth 2 (two) times daily. 10/17/14   Duanne Limerick, MD  Cholecalciferol (VITAMIN D-3) 1000 UNITS CAPS Take 1 capsule by mouth daily.    Historical Provider, MD  DULoxetine (CYMBALTA) 60 MG capsule Take 1 capsule by mouth 2 (two) times daily. Dr Vladimir Crofts 12/06/11   Historical Provider, MD  ibuprofen (ADVIL,MOTRIN) 800 MG tablet Take 1 tablet by mouth as needed.    Historical Provider, MD  methocarbamol (ROBAXIN) 750 MG tablet Take 1 tablet by mouth as needed. Dr Corky Downs 07/11/12   Historical Provider, MD  mirabegron ER (MYRBETRIQ) 50 MG TB24 tablet Take 1 tablet by mouth daily. Dr Conley Rolls 02/12/14   Historical Provider, MD  mupirocin ointment (BACTROBAN) 2 % Apply 1 application topically 2 (two) times daily. 11/08/14   Barbaraann Barthel, NP  sulfamethoxazole-trimethoprim (BACTRIM DS,SEPTRA DS) 800-160 MG per tablet Take 1 tablet by mouth 2 (two) times daily. 11/08/14   Barbaraann Barthel, NP   BP 114/79 mmHg  Pulse 97  Temp(Src) 97.5 F (36.4 C) (Tympanic)  Resp 16  Ht 5\' 6"  (1.676 m)  Wt 140 lb (63.504 kg)  BMI 22.61 kg/m2  SpO2 100% Physical Exam  Constitutional: She is oriented to person, place, and time. Vital signs are normal. She appears well-developed and well-nourished. No distress.  HENT:  Head: Normocephalic and atraumatic.  Right Ear: External ear normal.  Left Ear: External ear normal.  Nose: Nose normal.  Mouth/Throat: Oropharynx is clear and moist. No oropharyngeal exudate.  Eyes: Conjunctivae, EOM and lids are normal. Pupils are equal, round, and reactive to light. Right eye exhibits no discharge. Left eye exhibits no discharge. No scleral icterus.  Neck: Trachea normal and normal range of motion. Neck supple. No tracheal deviation present.  Cardiovascular: Normal rate, regular rhythm, normal heart  sounds and intact distal pulses.  Exam reveals no gallop and no friction rub.   No murmur heard. Pulmonary/Chest: Effort normal and breath sounds normal. No stridor. No respiratory distress. She has no wheezes. She has no rales. She exhibits no tenderness.  Abdominal: Soft. Bowel sounds are normal. She exhibits no distension and no mass. There is no tenderness. There is no rebound and no guarding. Hernia confirmed negative in the right inguinal area and confirmed negative in the left inguinal area.  Dull to percussion x 4 quads  Genitourinary:    Pelvic exam was performed with patient supine. No labial fusion. There is rash and tenderness on the right labia. There is no lesion or injury on the right labia. There is no rash, tenderness, lesion or injury on the left labia. No bleeding in the vagina. No vaginal discharge found.  Not tender superior to rectum and no tenting/TTP of soft tissue distal medial labia to rectum  Dr Dayton Scrape performed I&D of labial abscesses x 2 while I chaperoned patient; total 28ml lidocaine 1% infiltrated locally to lesions/right labia for pain control during I&D  Musculoskeletal: Normal range of motion. She exhibits no edema or tenderness.  Lymphadenopathy:    She has no cervical adenopathy.       Right: No inguinal adenopathy present.       Left: No inguinal adenopathy present.  Neurological: She is alert and oriented to person, place, and time. No cranial nerve deficit. She  exhibits normal muscle tone. Coordination normal.  Skin: Skin is warm, dry and intact. Rash noted. She is not diaphoretic. There is erythema. No pallor.  Psychiatric: She has a normal mood and affect. Her speech is normal and behavior is normal. Judgment and thought content normal. Cognition and memory are normal.  Nursing note and vitals reviewed.   ED Course  INCISION AND DRAINAGE Date/Time: 11/08/2014 9:40 AM Performed by: Albina Billet A Authorized by: Eustace Moore Consent: Verbal  consent obtained. Written consent not obtained. Risks and benefits: risks, benefits and alternatives were discussed Consent given by: patient Patient understanding: patient states understanding of the procedure being performed Patient identity confirmed: verbally with patient and arm band Time out: Immediately prior to procedure a "time out" was called to verify the correct patient, procedure, equipment, support staff and site/side marked as required. Type: abscess Body area: anogenital Anesthesia: local infiltration Local anesthetic: lidocaine 1% without epinephrine Anesthetic total: 7.5 ml Patient sedated: no Scalpel size: 15 Needle gauge: 22 Incision type: single with marsupialization Complexity: simple Drainage: purulent,  serosanguinous and  bloody Drainage amount: scant Wound treatment: wound left open and  drain placed Packing material: wick placed Patient tolerance: Patient tolerated the procedure well with no immediate complications   (including critical care time) Labs Review Labs Reviewed - No data to display  Imaging Review No results found.   MDM   1. Hydradenitis   2. Encounter for incision and drainage procedure   Discussed perimenopausal changes and recent URIs/carbuncle/antibiotic use may be increasing frequency of her lesions/oral herpes (cold sores).  Hidradenitis suppurativa is a long lasting (chronic), uncommon disease of the sweat glands. With this, boil-like lumps and scarring develop in the groin, some times under the arms (axillae), and under the breasts. It may also uncommonly occur behind the ears, in the crease of the buttocks, and around the genitals.  CAUSES  The cause is from a blocking of the sweat glands. They then become infected. It may cause drainage and odor. It is not contagious. So it cannot be given to someone else. It most often shows up in puberty (about 93 to 56 years of age). But it may happen much later. It is similar to acne which is a  disease of the sweat glands. This condition is slightly more common in African-Americans and women.  SYMPTOMS  Hidradenitis usually starts as one or more red, tender, swellings in the groin or under the arms (axilla).  Over a period of hours to days the lesions get larger. They often open to the skin surface, draining clear to yellow-colored fluid.  The infected area heals with scarring. DIAGNOSIS  Your caregiver makes this diagnosis by looking at you. Sometimes cultures (growing germs on plates in the lab) may be taken. This is to see what germ (bacterium) is causing the infection.  TREATMENT  Topical germ killing medicine applied to the skin (antibiotics) are the treatment of choice. Antibiotics taken by mouth (systemic) are sometimes needed when the condition is getting worse or is severe.  Avoid tight-fitting clothing which traps moisture in.  Dirt does not cause hidradenitis and it is not caused by poor hygiene.  Involved areas should be cleaned daily using an antibacterial soap. Some patients find that the liquid form of Lever 2000, applied to the involved areas as a lotion after bathing, can help reduce the odor related to this condition.  Sometimes surgery is needed to drain infected areas or remove scarred tissue. Removal of large amounts  of tissue is used only in severe cases.  Birth control pills may be helpful.  Oral retinoids (vitamin A derivatives) for 6 to 12 months which are effective for acne may also help this condition.  Weight loss will improve but not cure hidradenitis. It is made worse by being overweight. But the condition is not caused by being overweight.  This condition is more common in people who have had acne.  It may become worse under stress. There is no medical cure for hidradenitis. It can be controlled, but not cured. The condition usually continues for years with periods of getting worse and getting better (remission).  Time out performed, correct patient  (patient identifiers: name and date of birth), correct procedure, correct body part, correct laterality. Pt was prepped with hibiclens and rinsed with normal saline.  Local infiltration lesions performed with 1% lidocaine with good results.   Homeostasis was obtained with direct pressure. Superior lesion right labia external had wick placed 1/2 inch x 1 inch.  Bacitracin was then applied followed by a simple dressing.  The patient tolerated the procedure well, no apparent complications.  No specimen sent to pathology.  exitcare handout on hydradenitis and aftercare incision and drainage given to patient.  Wound care instructions provided.  Observe for any signs of infection or other problems e.g. Fever, worsening pain/purulent drainage/swelling, red streaks. Patient to keep appt with dermatologist scheduled for 1 August.  Follow up within next 1-3 days for wound re-evaluation with [facility] provider sooner if worsening symptoms discussed with patient I am available tomorrow, Sunday Dr Lowella Fairy and Monday Dr Dayton Scrape.  Bactrim DS po BID and bactroban or OTC antibiotic to affected areas twice a day.  May shower.  Do not soak in tub, hot tub, lake, pool until affected area healed.   Patient verbalized agreement and understanding of treatment plan.  P2:  Wound Care, hand washing    Barbaraann Barthel, NP 11/08/14 1053

## 2014-11-11 ENCOUNTER — Encounter: Payer: Self-pay | Admitting: Emergency Medicine

## 2014-11-11 ENCOUNTER — Ambulatory Visit
Admission: EM | Admit: 2014-11-11 | Discharge: 2014-11-11 | Disposition: A | Discharge status: Home / Self Care | Payer: BC Managed Care – PPO | Attending: Internal Medicine | Admitting: Internal Medicine

## 2014-11-11 DIAGNOSIS — L732 Hidradenitis suppurativa: Secondary | ICD-10-CM | POA: Diagnosis not present

## 2014-11-11 NOTE — Discharge Instructions (Signed)
Finish trimethoprim/sulfamethoxazole and continue putting mupirocin ointment on the healing abscesses. Have rechecked if increasing swelling/pain/drainage. Followup with dermatology as planned to discuss preventive treatments.  Hidradenitis Suppurativa, Sweat Gland Abscess Hidradenitis suppurativa is a long lasting (chronic), uncommon disease of the sweat glands. With this, boil-like lumps and scarring develop in the groin, some times under the arms (axillae), and under the breasts. It may also uncommonly occur behind the ears, in the crease of the buttocks, and around the genitals.  CAUSES  The cause is from a blocking of the sweat glands. They then become infected. It may cause drainage and odor. It is not contagious. So it cannot be given to someone else. It most often shows up in puberty (about 59 to 56 years of age). But it may happen much later. It is similar to acne which is a disease of the sweat glands. This condition is slightly more common in African-Americans and women. SYMPTOMS   Hidradenitis usually starts as one or more red, tender, swellings in the groin or under the arms (axilla).  Over a period of hours to days the lesions get larger. They often open to the skin surface, draining clear to yellow-colored fluid.  The infected area heals with scarring. DIAGNOSIS  Your caregiver makes this diagnosis by looking at you. Sometimes cultures (growing germs on plates in the lab) may be taken. This is to see what germ (bacterium) is causing the infection.  TREATMENT   Topical germ killing medicine applied to the skin (antibiotics) are the treatment of choice. Antibiotics taken by mouth (systemic) are sometimes needed when the condition is getting worse or is severe.  Avoid tight-fitting clothing which traps moisture in.  Dirt does not cause hidradenitis and it is not caused by poor hygiene.  Involved areas should be cleaned daily using an antibacterial soap. Some patients find that  the liquid form of Lever 2000, applied to the involved areas as a lotion after bathing, can help reduce the odor related to this condition.  Sometimes surgery is needed to drain infected areas or remove scarred tissue. Removal of large amounts of tissue is used only in severe cases.  Birth control pills may be helpful.  Oral retinoids (vitamin A derivatives) for 6 to 12 months which are effective for acne may also help this condition.  Weight loss will improve but not cure hidradenitis. It is made worse by being overweight. But the condition is not caused by being overweight.  This condition is more common in people who have had acne.  It may become worse under stress. There is no medical cure for hidradenitis. It can be controlled, but not cured. The condition usually continues for years with periods of getting worse and getting better (remission). Document Released: 12/16/2003 Document Revised: 07/26/2011 Document Reviewed: 08/03/2013 [facility] Patient Information 2015 [place], [place]. This information is not intended to replace advice given to you by your health care provider. Make sure you discuss any questions you have with your health care provider.

## 2014-11-11 NOTE — ED Notes (Signed)
Patient here for recheck from Friday.  Patient had abscess on her labia lanced.  Patient denies fever or pain.

## 2014-11-11 NOTE — ED Provider Notes (Signed)
CSN: [id number]     Arrival date & time 11/11/14  0707 History   First MD Initiated Contact with Patient 11/11/14 [phone]     Chief Complaint  Patient presents with  . Abscess  . Wound Check   HPI  53yo lady with hx vulvar hidradenitis, [facility] with exacerbation R labia, s/p I&D of 2 small R labial abscesses on 6/24.  Feeling a lot better" with improvement in pain/swelling.  Equivocal scant drainage from wounds this weekend. No fever, no malaise.  Past Medical History  Diagnosis Date  . Depression   . Anxiety   . Joint pain   . Hyperlipidemia    Past Surgical History  Procedure Laterality Date  . Pelvic fracture surgery    . Clavicle surgery  05/17/2000  . Bladder surgery     Family History  Problem Relation Age of Onset  . Heart disease Father    History  Substance Use Topics  . Smoking status: Current Every Day Smoker    Types: Cigarettes  . Smokeless tobacco: Not on file  . Alcohol Use: 0.0 oz/week    0 Standard drinks or equivalent per week   OB History    No data available     Review of Systems  All other systems reviewed and are negative.   Allergies  Review of patient's allergies indicates no known allergies.  Home Medications   Prior to Admission medications   Medication Sig Start Date End Date Taking? Authorizing Provider  ALPRAZolam Prudy Feeler(XANAX) 0.5 MG tablet Take 1 tablet by mouth 2 (two) times daily. Dr Vladimir CroftsHobb 11/15/11   Historical Provider, MD  atomoxetine (STRATTERA) 60 MG capsule Take 60 mg by mouth daily.    Historical Provider, MD  Cholecalciferol (VITAMIN D-3) 1000 UNITS CAPS Take 1 capsule by mouth daily.    Historical Provider, MD  DULoxetine (CYMBALTA) 60 MG capsule Take 1 capsule by mouth 2 (two) times daily. Dr Vladimir CroftsHobb 12/06/11   Historical Provider, MD  ibuprofen (ADVIL,MOTRIN) 800 MG tablet Take 1 tablet by mouth as needed.    Historical Provider, MD  methocarbamol (ROBAXIN) 750 MG tablet Take 1 tablet by mouth as needed. Dr Corky Downslsen 07/11/12    Historical Provider, MD  mirabegron ER (MYRBETRIQ) 50 MG TB24 tablet Take 1 tablet by mouth daily. Dr Conley RollsLe 02/12/14   Historical Provider, MD  mupirocin ointment (BACTROBAN) 2 % Apply 1 application topically 2 (two) times daily. 11/08/14   Barbaraann Barthelina A Betancourt, NP  sulfamethoxazole-trimethoprim (BACTRIM DS,SEPTRA DS) 800-160 MG per tablet Take 1 tablet by mouth 2 (two) times daily. 11/08/14   Jarold Songina A Betancourt, NP   BP 118/71 mmHg  Pulse 97  Temp(Src) 96.7 F (35.9 C) (Tympanic)  Resp 16  Ht 5\' 6"  (1.676 m)  Wt 140 lb (63.504 kg)  BMI 22.61 kg/m2  SpO2 95% Physical Exam  Constitutional: She is oriented to person, place, and time. No distress.  Alert, nicely groomed  HENT:  Head: Atraumatic.  Eyes:  Conjugate gaze, no eye redness/drainage  Neck: Neck supple.  Cardiovascular: Normal rate.   Pulmonary/Chest: No respiratory distress.  Abdominal: She exhibits no distension.  Genitourinary:  Right labia with residual induration of 2 small abscess sites noted previously, no fluctuance. Improvement in tenderness, decrease in size of abscesses. Not able to express any drainage.  Musculoskeletal: Normal range of motion.  No leg swelling  Neurological: She is alert and oriented to person, place, and time.  Skin: Skin is warm and dry.  No  cyanosis  Nursing note and vitals reviewed.   ED Course  Procedures   No procedure at the urgent care today.  MDM   1. Vulval hidradenitis suppurativa    Finish Bactrim, continue local wound care with mupirocin ointment. Follow-up with dermatologist as planned, to discuss preventive measures.    Eustace Moore, MD 11/11/14 [phone]

## 2015-05-13 ENCOUNTER — Encounter: Payer: Self-pay | Admitting: Family Medicine

## 2015-05-13 ENCOUNTER — Ambulatory Visit (INDEPENDENT_AMBULATORY_CARE_PROVIDER_SITE_OTHER): Payer: BC Managed Care – PPO | Admitting: Family Medicine

## 2015-05-13 VITALS — BP 120/80 | HR 64 | Ht 66.0 in | Wt 134.0 lb

## 2015-05-13 DIAGNOSIS — J0100 Acute maxillary sinusitis, unspecified: Secondary | ICD-10-CM | POA: Diagnosis not present

## 2015-05-13 DIAGNOSIS — J40 Bronchitis, not specified as acute or chronic: Secondary | ICD-10-CM

## 2015-05-13 MED ORDER — GUAIFENESIN-CODEINE 100-10 MG/5ML PO SOLN: 5.0000 mL | Freq: Three times a day (TID) | ORAL | Status: DC | PRN

## 2015-05-13 MED ORDER — AMOXICILLIN 500 MG PO CAPS: 500.0000 mg | ORAL_CAPSULE | Freq: Three times a day (TID) | ORAL | Status: DC

## 2015-05-13 NOTE — Progress Notes (Signed)
Name: Suzanne Chapman   MRN: [id number]    DOB: Jan 20, 1961   Date:05/13/2015       Progress Note  Subjective  Chief Complaint  Chief Complaint  Patient presents with  . Sinusitis    cough and cong- yellow/green production    Sinusitis This is a new problem. The current episode started 1 to 4 weeks ago. The problem has been waxing and waning since onset. There has been no fever. The pain is mild. Associated symptoms include chills, congestion, coughing, diaphoresis, headaches, a hoarse voice, sinus pressure and sneezing. Pertinent negatives include no ear pain, neck pain, shortness of breath, sore throat or swollen glands. Past treatments include acetaminophen and oral decongestants. The treatment provided no relief.    No problem-specific assessment & plan notes found for this encounter.   Past Medical History  Diagnosis Date  . Depression   . Anxiety   . Joint pain   . Hyperlipidemia     Past Surgical History  Procedure Laterality Date  . Pelvic fracture surgery    . Clavicle surgery  05/17/2000  . Bladder surgery      Family History  Problem Relation Age of Onset  . Heart disease Father     Social History   Social History  . Marital Status: Legally Separated    Spouse Name: N/A  . Number of Children: N/A  . Years of Education: N/A   Occupational History  . Not on file.   Social History Main Topics  . Smoking status: Current Every Day Smoker    Types: Cigarettes  . Smokeless tobacco: Not on file  . Alcohol Use: 0.0 oz/week    0 Standard drinks or equivalent per week  . Drug Use: No  . Sexual Activity: Not on file   Other Topics Concern  . Not on file   Social History Narrative    No Known Allergies   Review of Systems  Constitutional: Positive for chills and diaphoresis. Negative for fever, weight loss and malaise/fatigue.  HENT: Positive for congestion, hoarse voice, sinus pressure and sneezing. Negative for ear discharge, ear pain and sore throat.    Eyes: Negative for blurred vision.  Respiratory: Positive for cough. Negative for sputum production, shortness of breath and wheezing.   Cardiovascular: Negative for chest pain, palpitations and leg swelling.  Gastrointestinal: Negative for heartburn, nausea, abdominal pain, diarrhea, constipation, blood in stool and melena.  Genitourinary: Negative for dysuria, urgency, frequency and hematuria.  Musculoskeletal: Negative for myalgias, back pain, joint pain and neck pain.  Skin: Negative for rash.  Neurological: Positive for headaches. Negative for dizziness, tingling, sensory change and focal weakness.  Endo/Heme/Allergies: Negative for environmental allergies and polydipsia. Does not bruise/bleed easily.  Psychiatric/Behavioral: Negative for depression and suicidal ideas. The patient is not nervous/anxious and does not have insomnia.      Objective  Filed Vitals:   05/13/15 1007  BP: 120/80  Pulse: 64  Height:  (1.676 m)  Weight: 134 lb (60.782 kg)    Physical Exam  Constitutional: She is well-developed, well-nourished, and in no distress. No distress.  HENT:  Head: Normocephalic and atraumatic.  Right Ear: External ear normal.  Left Ear: External ear normal.  Nose: Nose normal.  Mouth/Throat: Oropharynx is clear and moist.  Eyes: Conjunctivae and EOM are normal. Pupils are equal, round, and reactive to light. Right eye exhibits no discharge. Left eye exhibits no discharge.  Neck: Normal range of motion. Neck supple. No JVD present. No thyromegaly  present.  Cardiovascular: Normal rate, regular rhythm, normal heart sounds and intact distal pulses.  Exam reveals no gallop and no friction rub.   No murmur heard. Pulmonary/Chest: Effort normal and breath sounds normal.  Abdominal: Soft. Bowel sounds are normal. She exhibits no mass. There is no tenderness. There is no guarding.  Musculoskeletal: Normal range of motion. She exhibits no edema.  Lymphadenopathy:    She has no  cervical adenopathy.  Neurological: She is alert. She has normal reflexes.  Skin: Skin is warm and dry. She is not diaphoretic.  Psychiatric: Mood and affect normal.  Nursing note and vitals reviewed.     Assessment & Plan  Problem List Items Addressed This Visit    None    Visit Diagnoses    Acute maxillary sinusitis, recurrence not specified    -  Primary    Relevant Medications    amoxicillin (AMOXIL) 500 MG capsule    guaiFENesin-codeine 100-10 MG/5ML syrup    Bronchitis             Dr. Hayden Rasmusseneanna Mylin Gignac [facility]  05/13/2015

## 2015-11-10 ENCOUNTER — Other Ambulatory Visit: Payer: Self-pay

## 2015-11-10 ENCOUNTER — Emergency Department
Admission: EM | Admit: 2015-11-10 | Discharge: 2015-11-11 | Disposition: A | Discharge status: Home / Self Care | Payer: BC Managed Care – PPO | Attending: Emergency Medicine | Admitting: Emergency Medicine

## 2015-11-10 DIAGNOSIS — E785 Hyperlipidemia, unspecified: Secondary | ICD-10-CM | POA: Diagnosis not present

## 2015-11-10 DIAGNOSIS — E860 Dehydration: Secondary | ICD-10-CM | POA: Insufficient documentation

## 2015-11-10 DIAGNOSIS — Z79899 Other long term (current) drug therapy: Secondary | ICD-10-CM | POA: Diagnosis not present

## 2015-11-10 DIAGNOSIS — Z7982 Long term (current) use of aspirin: Secondary | ICD-10-CM | POA: Diagnosis not present

## 2015-11-10 DIAGNOSIS — Z791 Long term (current) use of non-steroidal anti-inflammatories (NSAID): Secondary | ICD-10-CM | POA: Insufficient documentation

## 2015-11-10 DIAGNOSIS — F17210 Nicotine dependence, cigarettes, uncomplicated: Secondary | ICD-10-CM | POA: Insufficient documentation

## 2015-11-10 DIAGNOSIS — F329 Major depressive disorder, single episode, unspecified: Secondary | ICD-10-CM | POA: Diagnosis not present

## 2015-11-10 DIAGNOSIS — N3090 Cystitis, unspecified without hematuria: Secondary | ICD-10-CM | POA: Diagnosis not present

## 2015-11-10 DIAGNOSIS — R1032 Left lower quadrant pain: Secondary | ICD-10-CM | POA: Diagnosis present

## 2015-11-10 LAB — CBC
HCT: 41.7 % (ref 35.0–47.0)
Hemoglobin: 14.7 g/dL (ref 12.0–16.0)
MCH: 32.8 pg (ref 26.0–34.0)
MCHC: 35.2 g/dL (ref 32.0–36.0)
MCV: 93.2 fL (ref 80.0–100.0)
PLATELETS: 298 10*3/uL (ref 150–440)
RBC: 4.48 MIL/uL (ref 3.80–5.20)
RDW: 13.2 % (ref 11.5–14.5)
WBC: 12.4 10*3/uL — ABNORMAL HIGH (ref 3.6–11.0)

## 2015-11-10 LAB — COMPREHENSIVE METABOLIC PANEL
ALBUMIN: 4 g/dL (ref 3.5–5.0)
ALT: 11 U/L — AB (ref 14–54)
AST: 17 U/L (ref 15–41)
Alkaline Phosphatase: 79 U/L (ref 38–126)
Anion gap: 9 (ref 5–15)
BUN: 15 mg/dL (ref 6–20)
CO2: 24 mmol/L (ref 22–32)
CREATININE: 0.94 mg/dL (ref 0.44–1.00)
Calcium: 9.9 mg/dL (ref 8.9–10.3)
Chloride: 107 mmol/L (ref 101–111)
GFR calc Af Amer: >60 mL/min (ref >60)
GFR calc non Af Amer: >60 mL/min (ref >60)
GLUCOSE: 108 mg/dL — AB (ref 65–99)
Potassium: 3.3 mmol/L — ABNORMAL LOW (ref 3.5–5.1)
Sodium: 140 mmol/L (ref 135–145)
Total Bilirubin: 0.3 mg/dL (ref 0.3–1.2)
Total Protein: 6.9 g/dL (ref 6.5–8.1)

## 2015-11-10 LAB — LIPASE, BLOOD: Lipase: 23 U/L (ref 11–51)

## 2015-11-10 MED ORDER — MORPHINE SULFATE (PF) 4 MG/ML IV SOLN
4.0000 mg | Freq: Once | INTRAVENOUS | Status: AC
  Administered 2015-11-10: 4 mg via INTRAVENOUS

## 2015-11-10 MED ORDER — SODIUM CHLORIDE 0.9 % IV BOLUS (SEPSIS)
1000.0000 mL | Freq: Once | INTRAVENOUS | Status: AC
  Administered 2015-11-10: 1000 mL via INTRAVENOUS

## 2015-11-10 MED ORDER — MORPHINE SULFATE (PF) 4 MG/ML IV SOLN
INTRAVENOUS | Status: AC
  Administered 2015-11-10: 4 mg via INTRAVENOUS
  Filled 2015-11-10: qty 1

## 2015-11-10 MED ORDER — ONDANSETRON HCL 4 MG/2ML IJ SOLN
4.0000 mg | Freq: Once | INTRAMUSCULAR | Status: AC
  Administered 2015-11-10: 4 mg via INTRAVENOUS

## 2015-11-10 MED ORDER — ONDANSETRON HCL 4 MG/2ML IJ SOLN
INTRAMUSCULAR | Status: AC
  Administered 2015-11-10: 4 mg via INTRAVENOUS
  Filled 2015-11-10: qty 2

## 2015-11-10 NOTE — ED Notes (Signed)
Pt presents to ED via [facility] from home. Pt went to [place] beach and when she comes home she states she always gets a GI problem. Pt returned home from beach today and lower left abdominal pain and vomiting today. Pt denies diarrhea. Pt states dental pain on R side lower teeth Wednesday night that started all of this.

## 2015-11-10 NOTE — ED Notes (Signed)
Pt aware of need for urine sample.  

## 2015-11-11 LAB — URINALYSIS COMPLETE WITH MICROSCOPIC (ARMC ONLY)
BILIRUBIN URINE: NEGATIVE
GLUCOSE, UA: NEGATIVE mg/dL
Nitrite: POSITIVE — AB
PROTEIN: 30 mg/dL — AB
Specific Gravity, Urine: 1.02 (ref 1.005–1.030)
pH: 5 (ref 5.0–8.0)

## 2015-11-11 MED ORDER — OXYCODONE-ACETAMINOPHEN 5-325 MG PO TABS
1.0000 | ORAL_TABLET | Freq: Once | ORAL | Status: AC
  Administered 2015-11-11: 1 via ORAL
  Filled 2015-11-11: qty 1

## 2015-11-11 MED ORDER — CEFTRIAXONE SODIUM 1 G IJ SOLR
1.0000 g | Freq: Once | INTRAMUSCULAR | Status: AC
  Administered 2015-11-11: 1 g via INTRAVENOUS
  Filled 2015-11-11: qty 10

## 2015-11-11 MED ORDER — CEFPODOXIME PROXETIL 100 MG PO TABS: 100.0000 mg | ORAL_TABLET | Freq: Two times a day (BID) | ORAL | Status: AC

## 2015-11-11 MED ORDER — OXYCODONE-ACETAMINOPHEN 5-325 MG PO TABS: 1.0000 | ORAL_TABLET | Freq: Four times a day (QID) | ORAL | Status: AC | PRN

## 2015-11-11 MED ORDER — KETOROLAC TROMETHAMINE 30 MG/ML IJ SOLN
15.0000 mg | INTRAMUSCULAR | Status: AC
  Administered 2015-11-11: 15 mg via INTRAVENOUS
  Filled 2015-11-11: qty 1

## 2015-11-11 NOTE — ED Provider Notes (Signed)
[facility] Emergency Department Provider Note  ____________________________________________  Time seen: 12:00 AM  I have reviewed the triage vital signs and the nursing notes.   HISTORY  Chief Complaint Abdominal Pain    HPI Suzanne Chapman is a 55 y.o. female who complains of left lower quadrant abdominal pain for 24 hours. Also has nausea but no vomiting. No diarrhea. Had a normal bowel movement this morning. Claims of being in salt water at the beach recently over the past week. Denies any fevers or chills, no chest pain shortness of breath dizziness or syncope. Reports that she is drinking a lot of fluids well at the beach but was also in the sun for prolonged period of time. Pain is aching and crampy, nonradiating. Severe in intensity. No aggravating or alleviating factors. Constant but waxing and waning.     Past Medical History  Diagnosis Date  . Depression   . Anxiety   . Joint pain   . Hyperlipidemia      Patient Active Problem List   Diagnosis Date Noted  . Cognitive decline 04/18/2014  . Severe depression 04/18/2014  . Female genuine stress incontinence 04/19/2012  . 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 Pelvic fracture surgery    . Clavicle surgery  05/17/2000  . Bladder surgery       Current Outpatient Rx  Name  Route  Sig  Dispense  Refill  . ALPRAZolam (XANAX) 0.5 MG tablet   Oral   Take 1 tablet by mouth 2 (two) times daily. Dr Janeece RiggersSu         . amphetamine-dextroamphetamine (ADDERALL) 30 MG tablet   Oral   Take 1 tablet by mouth 2 (two) times daily.         Marland Kitchen. aspirin EC 81 MG tablet   Oral   Take 81 mg by mouth daily.         Marland Kitchen. atomoxetine (STRATTERA) 60 MG capsule   Oral   Take 60 mg by mouth daily. Reported on 05/13/2015          . Cetirizine HCl 10 MG CAPS   Oral   Take 10 mg by mouth daily.         . Cholecalciferol (VITAMIN D-3) 1000 UNITS CAPS   Oral   Take 1 capsule by mouth daily.         . Cyanocobalamin (RA VITAMIN B-12 TR) 1000 MCG TBCR   Oral   Take 1 tablet by mouth daily.         . DULoxetine (CYMBALTA) 60 MG capsule   Oral   Take 1 capsule by mouth 2 (two) times daily. Dr Janeece RiggersSu         . ibuprofen (ADVIL,MOTRIN) 800 MG tablet   Oral   Take 1 tablet by mouth as needed.         . methocarbamol (ROBAXIN) 750 MG tablet   Oral   Take 1 tablet by mouth as needed. Reported on 05/13/2015         . mirabegron ER (MYRBETRIQ) 50 MG TB24 tablet   Oral   Take 1 tablet by mouth daily. Dr Conley RollsLe         . PREMARIN vaginal cream   Vaginal   Place 1 application vaginally daily.  Dispense as written.   . cefpodoxime (VANTIN) 100 MG tablet   Oral   Take 1 tablet (100 mg total) by mouth 2 (two) times daily.   14 tablet   0   . oxyCODONE-acetaminophen (ROXICET) 5-325 MG tablet   Oral   Take 1 tablet by mouth every 6 (six) hours as needed for severe pain.   12 tablet   0      Allergies Review of patient's allergies indicates no known allergies.   Family History  Problem Relation Age of Onset  . Heart disease Father     Social History Social History  Substance Use Topics  . Smoking status: Current Every Day Smoker    Types: Cigarettes  . Smokeless tobacco: None  . Alcohol Use: 0.0 oz/week    0 Standard drinks or equivalent per week    Review of Systems  Constitutional:   No fever or chills.  ENT:   No sore throat. No rhinorrhea. Cardiovascular:   No chest pain. Respiratory:   No dyspnea or cough. Gastrointestinal:   Positive abdominal pain without vomiting or diarrhea or constipation.  Genitourinary:   Positive dysuria. Musculoskeletal:   Negative for focal pain or swelling Neurological:   Negative for headaches 10-point ROS otherwise  negative.  ____________________________________________   PHYSICAL EXAM:  VITAL SIGNS: ED Triage Vitals  Enc Vitals Group     BP 11/10/15 2208 158/86 mmHg     Pulse Rate 11/10/15 2208 66     Resp 11/10/15 2208 16     Temp 11/10/15 2208 97.6 F (36.4 C)     Temp Source 11/10/15 2208 Oral     SpO2 11/10/15 2208 100 %     Weight 11/10/15 2208 135 lb (61.236 kg)     Height 11/10/15 2208 5\' 6"  (1.676 m)     Head Cir --      Peak Flow --      Pain Score 11/10/15 2209 4     Pain Loc --      Pain Edu? --      Excl. in GC? --     Vital signs reviewed, nursing assessments reviewed.   Constitutional:   Alert and oriented. Well appearing and in no distress. Eyes:   No scleral icterus. No conjunctival pallor. PERRL. EOMI.  No nystagmus. ENT   Head:   Normocephalic and atraumatic.   Nose:   No congestion/rhinnorhea. No septal hematoma   Mouth/Throat:   Dry mucous membranes, no pharyngeal erythema. No peritonsillar mass.    Neck:   No stridor. No SubQ emphysema. No meningismus. Hematological/Lymphatic/Immunilogical:   No cervical lymphadenopathy. Cardiovascular:   RRR. Symmetric bilateral radial and DP pulses.  No murmurs.  Respiratory:   Normal respiratory effort without tachypnea nor retractions. Breath sounds are clear and equal bilaterally. No wheezes/rales/rhonchi. Gastrointestinal:   Soft with suprapubic tenderness. Non distended. There is no CVA tenderness.  No rebound, rigidity, or guarding. Genitourinary:   deferred Musculoskeletal:   Nontender with normal range of motion in all extremities. No joint effusions.  No lower extremity tenderness.  No edema. Neurologic:   Normal speech and language.  CN 2-10 normal. Motor grossly intact. No gross focal neurologic deficits are appreciated.  Skin:    Skin is warm, dry and intact. No rash noted.  No petechiae, purpura, or bullae.  ____________________________________________    LABS (pertinent  positives/negatives) (all labs ordered are listed, but only abnormal results are displayed) Labs Reviewed  COMPREHENSIVE METABOLIC PANEL - Abnormal; Notable  for the following:    Potassium 3.3 (*)    Glucose, Bld 108 (*)    ALT 11 (*)    All other components within normal limits  CBC - Abnormal; Notable for the following:    WBC 12.4 (*)    All other components within normal limits  URINALYSIS COMPLETEWITH MICROSCOPIC ([facility] ONLY) - Abnormal; Notable for the following:    Color, Urine YELLOW (*)    APPearance HAZY (*)    Ketones, ur 1+ (*)    Hgb urine dipstick 2+ (*)    Protein, ur 30 (*)    Nitrite POSITIVE (*)    Leukocytes, UA 3+ (*)    Bacteria, UA RARE (*)    Squamous Epithelial / LPF 0-5 (*)    All other components within normal limits  URINE CULTURE  LIPASE, BLOOD   ____________________________________________   EKG  Interpreted by me Sinus rhythm rate of 63, normal axis and intervals. Normal QRS ST segments and T waves.  ____________________________________________    RADIOLOGY    ____________________________________________   PROCEDURES   ____________________________________________   INITIAL IMPRESSION / ASSESSMENT AND PLAN / ED COURSE  Pertinent labs & imaging results that were available during my care of the patient were reviewed by me and considered in my medical decision making (see chart for details).  Patient presents with lower abdominal pain, history and exam consistent with cystitis.Considering the patient's symptoms, medical history, and physical examination today, I have low suspicion for cholecystitis or biliary pathology, pancreatitis, perforation or bowel obstruction, hernia, intra-abdominal abscess, AAA or dissection, volvulus or intussusception, mesenteric ischemia, or appendicitis.  Low suspicion for torsion PID STI or pregnancy. Vital signs unremarkable. Creatinine is normal. Urinalysis is strongly indicative of urinary tract  infection. Patient given IV ceftriaxone and IV fluids. She is feeling much better after analgesia and antiemetics. I'll discharge her home on Cefpodoxime. Urine culture sent. Follow-up with primary care. She does have a urologist and neurologist at [facility] and encouraged her to continue following up with him closely.     ____________________________________________   FINAL CLINICAL IMPRESSION(S) / ED DIAGNOSES  Final diagnoses:  Cystitis  Dehydration       Portions of this note were generated with dragon dictation software. Dictation errors may occur despite best attempts at proofreading.   Sharman Cheek, MD 11/11/15 [phone]

## 2015-11-11 NOTE — Discharge Instructions (Signed)
Dehydration, Adult °Dehydration is a condition in which you do not have enough fluid or water in your body. It happens when you take in less fluid than you lose. Vital organs such as the kidneys, brain, and heart cannot function without a proper amount of fluids. Any loss of fluids from the body can cause dehydration.  °Dehydration can range from mild to severe. This condition should be treated right away to help prevent it from becoming severe. °CAUSES  °This condition may be caused by: °· Vomiting. °· Diarrhea. °· Excessive sweating, such as when exercising in hot or humid weather. °· Not drinking enough fluid during strenuous exercise or during an illness. °· Excessive urine output. °· Fever. °· Certain medicines. °RISK FACTORS °This condition is more likely to develop in: °· People who are taking certain medicines that cause the body to lose excess fluid (diuretics).   °· People who have a chronic illness, such as diabetes, that may increase urination. °· Older adults.   °· People who live at high altitudes.   °· People who participate in endurance sports.   °SYMPTOMS  °Mild Dehydration °· Thirst. °· Dry lips. °· Slightly dry mouth. °· Dry, warm skin. °Moderate Dehydration °· Very dry mouth.   °· Muscle cramps.   °· Dark urine and decreased urine production.   °· Decreased tear production.   °· Headache.   °· Light-headedness, especially when you stand up from a sitting position.   °Severe Dehydration °· Changes in skin.   °¨ Cold and clammy skin.   °¨ Skin does not spring back quickly when lightly pinched and released.   °· Changes in body fluids.   °¨ Extreme thirst.   °¨ No tears.   °¨ Not able to sweat when body temperature is high, such as in hot weather.   °¨ Minimal urine production.   °· Changes in vital signs.   °¨ Rapid, weak pulse (more than 100 beats per minute when you are sitting still).   °¨ Rapid breathing.   °¨ Low blood pressure.   °· Other changes.   °¨ Sunken eyes.   °¨ Cold hands and feet.    °¨ Confusion. °¨ Lethargy and difficulty being awakened. °¨ Fainting (syncope).   °¨ Short-term weight loss.   °¨ Unconsciousness. °DIAGNOSIS  °This condition may be diagnosed based on your symptoms. You may also have tests to determine how severe your dehydration is. These tests may include:  °· Urine tests.   °· Blood tests.   °TREATMENT  °Treatment for this condition depends on the severity. Mild or moderate dehydration can often be treated at home. Treatment should be started right away. Do not wait until dehydration becomes severe. Severe dehydration needs to be treated at the hospital. °Treatment for Mild Dehydration °· Drinking plenty of water to replace the fluid you have lost.   °· Replacing minerals in your blood (electrolytes) that you may have lost.   °Treatment for Moderate Dehydration  °· Consuming oral rehydration solution (ORS). °Treatment for Severe Dehydration °· Receiving fluid through an IV tube.   °· Receiving electrolyte solution through a feeding tube that is passed through your nose and into your stomach (nasogastric tube or NG tube). °· Correcting any abnormalities in electrolytes. °HOME CARE INSTRUCTIONS  °· Drink enough fluid to keep your urine clear or pale yellow.   °· Drink water or fluid slowly by taking small sips. You can also try sucking on ice cubes.  °· Have food or beverages that contain electrolytes. Examples include bananas and sports drinks. °· Take over-the-counter and prescription medicines only as told by your health care provider.   °· Prepare ORS according to the manufacturer's instructions. Take sips   of ORS every 5 minutes until your urine returns to normal. °· If you have vomiting or diarrhea, continue to try to drink water, ORS, or both.   °· If you have diarrhea, avoid:   °¨ Beverages that contain caffeine.   °¨ Fruit juice.   °¨ Milk.    °¨ Carbonated soft drinks. °· Do not take salt tablets. This can lead to the condition of having too much sodium in your body  (hypernatremia).   °SEEK MEDICAL CARE IF: °· You cannot eat or drink without vomiting. °· You have had moderate diarrhea during a period of more than 24 hours. °· You have a fever. °SEEK IMMEDIATE MEDICAL CARE IF:  °· You have extreme thirst. °· You have severe diarrhea. °· You have not urinated in 6-8 hours, or you have urinated only a small amount of very dark urine. °· You have shriveled skin. °· You are dizzy, confused, or both. °  °This information is not intended to replace advice given to you by your health care provider. Make sure you discuss any questions you have with your health care provider. °  °Document Released: 05/03/2005 Document Revised: 01/22/2015 Document Reviewed: 09/18/2014 °Elsevier Interactive Patient Education ©2016 [employer] ° °Urinary Tract Infection °Urinary tract infections (UTIs) can develop anywhere along your urinary tract. Your urinary tract is your body's drainage system for removing wastes and extra water. Your urinary tract includes two kidneys, two ureters, a bladder, and a urethra. Your kidneys are a pair of bean-shaped organs. Each kidney is about the size of your fist. They are located below your ribs, one on each side of your spine. °CAUSES °Infections are caused by microbes, which are microscopic organisms, including fungi, viruses, and bacteria. These organisms are so small that they can only be seen through a microscope. Bacteria are the microbes that most commonly cause UTIs. °SYMPTOMS  °Symptoms of UTIs may vary by age and gender of the patient and by the location of the infection. Symptoms in young women typically include a frequent and intense urge to urinate and a painful, burning feeling in the bladder or urethra during urination. Older women and men are more likely to be tired, shaky, and weak and have muscle aches and abdominal pain. A fever may mean the infection is in your kidneys. Other symptoms of a kidney infection include pain in your back or sides below  the ribs, nausea, and vomiting. °DIAGNOSIS °To diagnose a UTI, your caregiver will ask you about your symptoms. Your caregiver will also ask you to provide a urine sample. The urine sample will be tested for bacteria and white blood cells. White blood cells are made by your body to help fight infection. °TREATMENT  °Typically, UTIs can be treated with medication. Because most UTIs are caused by a bacterial infection, they usually can be treated with the use of antibiotics. The choice of antibiotic and length of treatment depend on your symptoms and the type of bacteria causing your infection. °HOME CARE INSTRUCTIONS °· If you were prescribed antibiotics, take them exactly as your caregiver instructs you. Finish the medication even if you feel better after you have only taken some of the medication. °· Drink enough water and fluids to keep your urine clear or pale yellow. °· Avoid caffeine, tea, and carbonated beverages. They tend to irritate your bladder. °· Empty your bladder often. Avoid holding urine for long periods of time. °· Empty your bladder before and after sexual intercourse. °· After a bowel movement, women should cleanse from front to back.   Use each tissue only once. °SEEK MEDICAL CARE IF:  °· You have back pain. °· You develop a fever. °· Your symptoms do not begin to resolve within 3 days. °SEEK IMMEDIATE MEDICAL CARE IF:  °· You have severe back pain or lower abdominal pain. °· You develop chills. °· You have nausea or vomiting. °· You have continued burning or discomfort with urination. °MAKE SURE YOU:  °· Understand these instructions. °· Will watch your condition. °· Will get help right away if you are not doing well or get worse. °  °This information is not intended to replace advice given to you by your health care provider. Make sure you discuss any questions you have with your health care provider. °  °Document Released: 02/10/2005 Document Revised: 01/22/2015 Document Reviewed:  06/11/2011 °Elsevier Interactive Patient Education ©2016 [employer] ° °

## 2015-11-13 LAB — URINE CULTURE

## 2016-04-23 ENCOUNTER — Ambulatory Visit (INDEPENDENT_AMBULATORY_CARE_PROVIDER_SITE_OTHER): Payer: Medicare Other | Admitting: Family Medicine

## 2016-04-23 VITALS — BP 136/86 | HR 68 | Temp 97.5°F | Ht 66.0 in | Wt 134.0 lb

## 2016-04-23 DIAGNOSIS — N3090 Cystitis, unspecified without hematuria: Secondary | ICD-10-CM | POA: Diagnosis not present

## 2016-04-23 DIAGNOSIS — N3281 Overactive bladder: Secondary | ICD-10-CM

## 2016-04-23 MED ORDER — CIPROFLOXACIN HCL 250 MG PO TABS: 250.0000 mg | ORAL_TABLET | Freq: Two times a day (BID) | ORAL | 0 refills | Status: AC

## 2016-04-23 NOTE — Progress Notes (Signed)
Name: Suzanne Chapman   MRN: [id number]    DOB: June 02, 1960   Date:04/23/2016       Progress Note  Subjective  Chief Complaint  Chief Complaint  Patient presents with  . Urinary Tract Infection    Pt stated have frequency urinating, chills.    Urinary Tract Infection   This is a recurrent problem. The current episode started in the past 7 days. The problem occurs intermittently. The problem has been waxing and waning. The patient is experiencing no pain. There has been no fever. Associated symptoms include chills, frequency, hematuria, nausea, sweats and urgency. Pertinent negatives include no discharge, flank pain, hesitancy, possible pregnancy or vomiting. Associated symptoms comments: incontinence. She has tried nothing for the symptoms. The treatment provided mild relief. There is no history of catheterization, kidney stones, recurrent UTIs, a single kidney, urinary stasis or a urological procedure.    No problem-specific Assessment & Plan notes found for this encounter.   Past Medical History:  Diagnosis Date  . Anxiety   . Depression   . Hyperlipidemia   . Joint pain     Past Surgical History:  Procedure Laterality Date  . BLADDER SURGERY    . CLAVICLE SURGERY  05/17/2000  . PELVIC FRACTURE SURGERY      Family History  Problem Relation Age of Onset  . Heart disease Father     Social History   Social History  . Marital status: Legally Separated    Spouse name: N/A  . Number of children: N/A  . Years of education: N/A   Occupational History  . Not on file.   Social History Main Topics  . Smoking status: Current Every Day Smoker    Types: Cigarettes  . Smokeless tobacco: Not on file  . Alcohol use 0.0 oz/week  . Drug use: No  . Sexual activity: Not on file   Other Topics Concern  . Not on file   Social History Narrative  . No narrative on file    No Known Allergies   Review of Systems  Constitutional: Positive for chills. Negative for fever,  malaise/fatigue and weight loss.  HENT: Negative for ear discharge, ear pain and sore throat.   Eyes: Negative for blurred vision.  Respiratory: Negative for cough, sputum production, shortness of breath and wheezing.   Cardiovascular: Negative for chest pain, palpitations and leg swelling.  Gastrointestinal: Positive for nausea. Negative for abdominal pain, blood in stool, constipation, diarrhea, heartburn, melena and vomiting.  Genitourinary: Positive for frequency, hematuria and urgency. Negative for dysuria, flank pain and hesitancy.  Musculoskeletal: Negative for back pain, joint pain, myalgias and neck pain.  Skin: Negative for rash.  Neurological: Negative for dizziness, tingling, sensory change, focal weakness and headaches.  Endo/Heme/Allergies: Negative for environmental allergies and polydipsia. Does not bruise/bleed easily.  Psychiatric/Behavioral: Negative for depression and suicidal ideas. The patient is not nervous/anxious and does not have insomnia.      Objective  Vitals:   04/23/16 0805  BP: 136/86  Pulse: 68  Temp: 97.5 F (36.4 C)  SpO2: 96%  Weight: 134 lb (60.8 kg)  Height: 5\' 6"  (1.676 m)    Physical Exam  Constitutional: She is well-developed, well-nourished, and in no distress. No distress.  HENT:  Head: Normocephalic and atraumatic.  Right Ear: External ear normal.  Left Ear: External ear normal.  Nose: Nose normal.  Mouth/Throat: Oropharynx is clear and moist.  Eyes: Conjunctivae and EOM are normal. Pupils are equal, round, and reactive to light.  Right eye exhibits no discharge. Left eye exhibits no discharge.  Neck: Normal range of motion. Neck supple. No JVD present. No thyromegaly present.  Cardiovascular: Normal rate, regular rhythm, normal heart sounds and intact distal pulses.  Exam reveals no gallop and no friction rub.   No murmur heard. Pulmonary/Chest: Effort normal and breath sounds normal. She has no wheezes. She has no rales.   Abdominal: Soft. Bowel sounds are normal. She exhibits no mass. There is no tenderness. There is no rebound and no guarding.  Musculoskeletal: Normal range of motion. She exhibits no edema or tenderness.  Lymphadenopathy:    She has no cervical adenopathy.  Neurological: She is alert. She has normal reflexes.  Skin: Skin is warm and dry. She is not diaphoretic.  Psychiatric: Mood and affect normal.      Assessment & Plan  Problem List Items Addressed This Visit      Genitourinary   Detrusor muscle hypertonia    Other Visit Diagnoses    Cystitis    -  Primary   Relevant Medications   ciprofloxacin (CIPRO) 250 MG tablet   Other Relevant Orders   Urine Culture        Dr. Elizabeth Sauereanna Jones [facility]  04/23/16

## 2016-04-28 LAB — URINE CULTURE

## 2016-04-29 ENCOUNTER — Other Ambulatory Visit: Payer: Self-pay

## 2016-08-10 ENCOUNTER — Other Ambulatory Visit: Payer: Self-pay | Admitting: Obstetrics and Gynecology

## 2016-08-10 DIAGNOSIS — Z1231 Encounter for screening mammogram for malignant neoplasm of breast: Secondary | ICD-10-CM

## 2016-08-11 ENCOUNTER — Ambulatory Visit
Admission: RE | Admit: 2016-08-11 | Discharge: 2016-08-11 | Disposition: A | Discharge status: Home / Self Care | Payer: Medicare Other | Source: Ambulatory Visit | Attending: Obstetrics and Gynecology | Admitting: Obstetrics and Gynecology

## 2016-08-11 DIAGNOSIS — Z1231 Encounter for screening mammogram for malignant neoplasm of breast: Secondary | ICD-10-CM | POA: Insufficient documentation

## 2017-05-24 ENCOUNTER — Other Ambulatory Visit: Payer: Self-pay | Admitting: Internal Medicine

## 2017-05-24 ENCOUNTER — Other Ambulatory Visit: Payer: Self-pay | Admitting: Obstetrics and Gynecology

## 2017-05-24 DIAGNOSIS — Z1231 Encounter for screening mammogram for malignant neoplasm of breast: Secondary | ICD-10-CM

## 2017-07-07 ENCOUNTER — Ambulatory Visit: Payer: Self-pay | Admitting: Obstetrics and Gynecology

## 2017-08-16 ENCOUNTER — Ambulatory Visit
Admission: RE | Admit: 2017-08-16 | Discharge: 2017-08-16 | Disposition: A | Discharge status: Home / Self Care | Payer: Medicare Other | Source: Ambulatory Visit | Attending: Internal Medicine | Admitting: Internal Medicine

## 2017-08-16 DIAGNOSIS — R928 Other abnormal and inconclusive findings on diagnostic imaging of breast: Secondary | ICD-10-CM | POA: Insufficient documentation

## 2017-08-16 DIAGNOSIS — Z1231 Encounter for screening mammogram for malignant neoplasm of breast: Secondary | ICD-10-CM | POA: Insufficient documentation

## 2017-08-18 ENCOUNTER — Other Ambulatory Visit: Payer: Self-pay | Admitting: Internal Medicine

## 2017-08-18 DIAGNOSIS — N6489 Other specified disorders of breast: Secondary | ICD-10-CM

## 2017-08-18 DIAGNOSIS — R928 Other abnormal and inconclusive findings on diagnostic imaging of breast: Secondary | ICD-10-CM

## 2017-08-31 ENCOUNTER — Ambulatory Visit: Payer: Medicare Other | Attending: Internal Medicine

## 2017-08-31 ENCOUNTER — Other Ambulatory Visit: Payer: Medicare Other

## 2022-12-02 ENCOUNTER — Other Ambulatory Visit: Payer: Self-pay | Admitting: Internal Medicine

## 2022-12-02 DIAGNOSIS — Z0000 Encounter for general adult medical examination without abnormal findings: Secondary | ICD-10-CM

## 2022-12-02 DIAGNOSIS — E782 Mixed hyperlipidemia: Secondary | ICD-10-CM

## 2022-12-20 ENCOUNTER — Other Ambulatory Visit: Payer: Medicare PPO
